# Patient Record
Sex: Male | Born: 1995 | Race: White | Hispanic: No | Marital: Single | Smoking: Never smoker
Health system: Southern US, Community
[De-identification: ages and names within clinical notes are randomized; demographics above are authoritative.]

## PROBLEM LIST (undated history)

## (undated) DIAGNOSIS — K8020 Calculus of gallbladder without cholecystitis without obstruction: Secondary | ICD-10-CM

---

## 2016-08-28 ENCOUNTER — Encounter (HOSPITAL_COMMUNITY): Payer: Self-pay | Admitting: *Deleted

## 2016-08-28 ENCOUNTER — Emergency Department (HOSPITAL_COMMUNITY): Payer: BLUE CROSS/BLUE SHIELD

## 2016-08-28 ENCOUNTER — Observation Stay (HOSPITAL_COMMUNITY)
Admission: EM | Admit: 2016-08-28 | Discharge: 2016-08-30 | Disposition: A | Discharge status: Home / Self Care | Payer: BLUE CROSS/BLUE SHIELD | Attending: General Surgery | Admitting: General Surgery

## 2016-08-28 DIAGNOSIS — K8065 Calculus of gallbladder and bile duct with chronic cholecystitis with obstruction: Secondary | ICD-10-CM | POA: Diagnosis not present

## 2016-08-28 DIAGNOSIS — K8021 Calculus of gallbladder without cholecystitis with obstruction: Secondary | ICD-10-CM | POA: Diagnosis present

## 2016-08-28 DIAGNOSIS — E876 Hypokalemia: Secondary | ICD-10-CM | POA: Diagnosis present

## 2016-08-28 DIAGNOSIS — K8020 Calculus of gallbladder without cholecystitis without obstruction: Secondary | ICD-10-CM

## 2016-08-28 DIAGNOSIS — R1011 Right upper quadrant pain: Secondary | ICD-10-CM

## 2016-08-28 DIAGNOSIS — Z419 Encounter for procedure for purposes other than remedying health state, unspecified: Secondary | ICD-10-CM

## 2016-08-28 HISTORY — DX: Calculus of gallbladder without cholecystitis without obstruction: K80.20

## 2016-08-28 LAB — COMPREHENSIVE METABOLIC PANEL
ALBUMIN: 4.6 g/dL (ref 3.5–5.0)
ALT: 13 U/L — AB (ref 17–63)
AST: 19 U/L (ref 15–41)
Alkaline Phosphatase: 36 U/L — ABNORMAL LOW (ref 38–126)
Anion gap: 10 (ref 5–15)
BUN: 11 mg/dL (ref 6–20)
CHLORIDE: 103 mmol/L (ref 101–111)
CO2: 25 mmol/L (ref 22–32)
Calcium: 9.2 mg/dL (ref 8.9–10.3)
Creatinine, Ser: 0.85 mg/dL (ref 0.61–1.24)
GFR calc Af Amer: >60 mL/min (ref >60)
Glucose, Bld: 90 mg/dL (ref 65–99)
Potassium: 3.1 mmol/L — ABNORMAL LOW (ref 3.5–5.1)
SODIUM: 138 mmol/L (ref 135–145)
Total Bilirubin: 2.6 mg/dL — ABNORMAL HIGH (ref 0.3–1.2)
Total Protein: 6.8 g/dL (ref 6.5–8.1)

## 2016-08-28 LAB — CBC WITH DIFFERENTIAL/PLATELET
BASOS ABS: 0 10*3/uL (ref 0.0–0.1)
BASOS PCT: 0 %
EOS ABS: 0.3 10*3/uL (ref 0.0–0.7)
EOS PCT: 5 %
HCT: 44.2 % (ref 39.0–52.0)
Hemoglobin: 15.1 g/dL (ref 13.0–17.0)
Lymphocytes Relative: 28 %
Lymphs Abs: 2 10*3/uL (ref 0.7–4.0)
MCH: 28.8 pg (ref 26.0–34.0)
MCHC: 34.2 g/dL (ref 30.0–36.0)
MCV: 84.2 fL (ref 78.0–100.0)
MONO ABS: 0.6 10*3/uL (ref 0.1–1.0)
Monocytes Relative: 9 %
Neutro Abs: 4.3 10*3/uL (ref 1.7–7.7)
Neutrophils Relative %: 58 %
PLATELETS: 210 10*3/uL (ref 150–400)
RBC: 5.25 MIL/uL (ref 4.22–5.81)
RDW: 13.7 % (ref 11.5–15.5)
WBC: 7.3 10*3/uL (ref 4.0–10.5)

## 2016-08-28 LAB — SURGICAL PCR SCREEN
MRSA, PCR: NEGATIVE
STAPHYLOCOCCUS AUREUS: POSITIVE — AB

## 2016-08-28 LAB — LIPASE, BLOOD: LIPASE: 17 U/L (ref 11–51)

## 2016-08-28 MED ORDER — ONDANSETRON 4 MG PO TBDP: 4.0000 mg | ORAL_TABLET | Freq: Four times a day (QID) | ORAL | Status: DC | PRN

## 2016-08-28 MED ORDER — MORPHINE SULFATE (PF) 4 MG/ML IV SOLN
4.0000 mg | Freq: Once | INTRAVENOUS | Status: AC
  Administered 2016-08-28: 4 mg via INTRAVENOUS
  Filled 2016-08-28: qty 1

## 2016-08-28 MED ORDER — MORPHINE SULFATE (PF) 4 MG/ML IV SOLN
1.0000 mg | INTRAVENOUS | Status: DC | PRN
  Administered 2016-08-29 – 2016-08-30 (×2): 1 mg via INTRAVENOUS
  Filled 2016-08-28 (×2): qty 1

## 2016-08-28 MED ORDER — DEXTROSE 5 % IV SOLN
2.0000 g | Freq: Every day | INTRAVENOUS | Status: DC
  Administered 2016-08-28 – 2016-08-30 (×3): 2 g via INTRAVENOUS
  Filled 2016-08-28 (×3): qty 2

## 2016-08-28 MED ORDER — ONDANSETRON HCL 4 MG/2ML IJ SOLN
4.0000 mg | Freq: Four times a day (QID) | INTRAMUSCULAR | Status: DC | PRN
Start: 2016-08-28 — End: 2016-08-30
  Administered 2016-08-29: 4 mg via INTRAVENOUS

## 2016-08-28 MED ORDER — DIPHENHYDRAMINE HCL 25 MG PO CAPS: 25.0000 mg | ORAL_CAPSULE | Freq: Four times a day (QID) | ORAL | Status: DC | PRN

## 2016-08-28 MED ORDER — DIPHENHYDRAMINE HCL 50 MG/ML IJ SOLN
25.0000 mg | Freq: Four times a day (QID) | INTRAMUSCULAR | Status: DC | PRN
  Administered 2016-08-29: 25 mg via INTRAVENOUS
  Filled 2016-08-28: qty 1

## 2016-08-28 MED ORDER — POTASSIUM CHLORIDE IN NACL 40-0.9 MEQ/L-% IV SOLN
INTRAVENOUS | Status: DC
  Administered 2016-08-28 – 2016-08-30 (×5): 100 mL/h via INTRAVENOUS
  Filled 2016-08-28 (×6): qty 1000

## 2016-08-28 NOTE — Progress Notes (Signed)
Called by surgery to see this patient in consult.  Patient with RUQ abdominal pain.  Total bili slightly elevated and gallstones noted on ultrasound.  CBD mildly dilated.  Dr. Marina GoodellPerry recommending cholecystectomy with IOC and if positive then to call us back and we will do a formal consult for ERCP at that point.  GI ATTENDING  As above. Labs and x-rays reviewed. Could have CBC stone but none by US and LFT's normal except mild elevation of bilirubin. No cholangitis. Would proceed to lap chole with IOC. If negative, able to avoid ERCP. If +, we will proceed with ERCP. Thanks  Wilhemina BonitoJohn N. Eda KeysPerry, Jr., M.D. [facility] Division of Gastroenterology

## 2016-08-28 NOTE — ED Provider Notes (Signed)
[facility]-EMERGENCY DEPT Provider Note   CSN: [id number] Arrival date & time: 08/28/16  0454     History   Chief Complaint Chief Complaint  Patient presents with  . Abdominal Pain    HPI Jerry Hamilton is a 21 y.o. male.  The history is provided by the patient and medical records.    21 y.o. M with hx of gallstones here with right upper abdominal pain which started about 3 hours ago after eating a piece of pizza.  Localized to RUQ.  No N/V/D.  No fever, chills, sweats.  Does report hx of gallstones in the past with similar symptoms.  No prior abdominal surgeries.  No medications taken PTA.  Reports hx of gallstones, however has never seen surgery for this.States he saw GI briefly, had MRCP.  Past Medical History:  Diagnosis Date  . Gallstones     There are no active problems to display for this patient.   History reviewed. No pertinent surgical history.     Home Medications    Prior to Admission medications   Not on File    Family History History reviewed. No pertinent family history.  Social History Social History  Substance Use Topics  . Smoking status: Never Smoker  . Smokeless tobacco: Never Used  . Alcohol use Yes     Allergies   Patient has no known allergies.   Review of Systems Review of Systems  Gastrointestinal: Positive for abdominal pain.  All other systems reviewed and are negative.    Physical Exam Updated Vital Signs BP (!) 147/91 (BP Location: Right Arm)   Pulse 67   Temp 97.5 F (36.4 C) (Oral)   Resp 18   Ht 5\' 11"  (1.803 m)   Wt 77.1 kg   BMI 23.71 kg/m   Physical Exam  Constitutional: He is oriented to person, place, and time. He appears well-developed and well-nourished.  HENT:  Head: Normocephalic and atraumatic.  Mouth/Throat: Oropharynx is clear and moist.  Eyes: Conjunctivae and EOM are normal. Pupils are equal, round, and reactive to light.  Neck: Normal range of motion.  Cardiovascular: Normal rate, regular  rhythm and normal heart sounds.   Pulmonary/Chest: Effort normal and breath sounds normal.  Abdominal: Soft. Bowel sounds are normal. There is tenderness in the right upper quadrant. There is no tenderness at McBurney's point.  Musculoskeletal: Normal range of motion.  Neurological: He is alert and oriented to person, place, and time.  Skin: Skin is warm and dry.  Psychiatric: He has a normal mood and affect.  Nursing note and vitals reviewed.    ED Treatments / Results  Labs (all labs ordered are listed, but only abnormal results are displayed) Labs Reviewed  COMPREHENSIVE METABOLIC PANEL - Abnormal; Notable for the following:       Result Value   Potassium 3.1 (*)    ALT 13 (*)    Alkaline Phosphatase 36 (*)    Total Bilirubin 2.6 (*)    All other components within normal limits  CBC WITH DIFFERENTIAL/PLATELET  LIPASE, BLOOD  HIV ANTIBODY (ROUTINE TESTING)    EKG  EKG Interpretation None       Radiology [place] Abdomen Limited Ruq  Result Date: 08/28/2016 CLINICAL DATA:  Acute right upper quadrant pain.  Chololithiasis. EXAM: US ABDOMEN LIMITED - RIGHT UPPER QUADRANT COMPARISON:  None. FINDINGS: Gallbladder: Multiple gallstones freely mobile within the gallbladder, largest stone 2.2 cm in diameter. Gallbladder wall thickness is normal. No Murphy sign. Common bile duct: Diameter: Dilatation of  the extrahepatic biliary tree with common duct measured up to 9.4 mm. No visible stone, but the presumption is that there is a stone at the distal duct/ ampulla. Liver: No parenchymal abnormality.  Echogenicity normal. IMPRESSION: Chololithiasis with multiple freely mobile stones within the gallbladder. The largest stone measures 2.2 cm in diameter. There are several smaller stones of a size that could enter the ductal system. In fact, the common bile duct is dilated up to 9.4 mm. A distal stone is not identified, but this study suggests the presence of a distal common bile duct or ampullary  stone. Electronically Signed   By: Paulina Fusi M.D.   On: 08/28/2016 07:03    Procedures Procedures (including critical care time)  Medications Ordered in ED Medications - No data to display   Initial Impression / Assessment and Plan / ED Course  I have reviewed the triage vital signs and the nursing notes.  Pertinent labs & imaging results that were available during my care of the patient were reviewed by me and considered in my medical decision making (see chart for details).  21 year old male here with upper abdominal pain. Reports history of gallstones with similar symptoms. He is afebrile and nontoxic. He does have tenderness in the right upper quadrant and epigastric region on exam without definitive Murphy sign. Lab work with mildly elevated bili at 2.6. He does not appear jaundiced.  Ultrasound was obtained revealing gallstones with noted is dilatation of the common bile duct is 9.4 mm. No definitive stone identified, however findings are concerning for choledocholithiasis versus ampullary stone.  On chart review, patient had similar ultrasound in October 2017, however CBD dilatation has increased.  Given his repeat evaluation, will speak with general surgery for recommendations.  Discussed with general surgery-- will admit for ongoing care. Plan for cholecystectomy during admission, GI consulted as well for possible ERCP.  Final Clinical Impressions(s) / ED Diagnoses   Final diagnoses:  RUQ pain  Calculus of gallbladder with biliary obstruction but without cholecystitis  Hypokalemia    New Prescriptions New Prescriptions   No medications on file     Garlon Hatchet, PA-C 08/28/16 1610    Pricilla Loveless, MD 09/06/16 1538

## 2016-08-28 NOTE — ED Triage Notes (Signed)
Patient is alert and oriented x4.  He is complaining of upper abdominal pain that woke him from sleep.  Patient states that he has a history of gallstones and states that it feels similar to the past gallstone attacks.  Currently he rates his pain 6 of 10 without nausea or vomiting.

## 2016-08-28 NOTE — H&P (Signed)
[facility] Admission Note  Jerry Hamilton 25-Nov-1995  [id number].    Requesting MD: Quincy Carnes PA Chief Complaint/Reason for Consult: Gallstones  HPI:  Jerry Hamilton is a 21yo male who presented to [facility] earlier this morning with acute onset RUQ pain after eating pizza last night. The pain is constant and does not radiate. Denies nausea, vomiting, fever, chills. He does have a history of known gallstones. States that he has had similar episodes in the past that resolved without intervention. States that he saw GI in [place] a few months ago who advised that at some point he would need a cholecystectomy. He had an MRCP at the time, no ERCP. Last meal last night  Hospital workup: - u/s shows chololithiasis with multiple freely mobile stones within the gallbladder, largest stone measures 2.2 cm in diameter; several smaller stones of a size that could enter the ductal system; common bile duct is dilated up to 9.4 mm; a distal stone is not identified, but this study suggests the presence of a distal common bile duct or ampullary stone - AST, ALT, alk phos, lipase, and WBL WNL - bilirubin 2.6  No significant PMH Abdominal surgical history: none Anticoagulants: none Nonsmoker Employment: college student  ROS: Review of Systems  Constitutional: Negative.   HENT: Negative.   Eyes: Negative.   Respiratory: Negative.   Cardiovascular: Negative.   Gastrointestinal: Positive for abdominal pain. Negative for blood in stool, constipation, diarrhea, heartburn, melena, nausea and vomiting.  Genitourinary: Negative.   Musculoskeletal: Negative.   Skin: Negative.   Neurological: Negative.   All systems reviewed and otherwise negative except for as above  History reviewed. No pertinent family history.  Past Medical History:  Diagnosis Date  . Gallstones     History reviewed. No pertinent surgical history.  Social History:  reports that he has never smoked. He has never  used smokeless tobacco. He reports that he drinks alcohol. His drug history is not on file.  Allergies: No Known Allergies   (Not in a hospital admission)  Prior to Admission medications   Not on File    Blood pressure 124/77, pulse 69, temperature 97.9 F (36.6 C), temperature source Oral, resp. rate 14, height _0  (1.803 m), weight 170 lb (77.1 kg), SpO2 99 %. Physical Exam: General: pleasant, WD/WN white male who is laying in bed in NAD HEENT: head is normocephalic, atraumatic.  Sclera are noninjected.  Mouth is pink and moist Heart: regular, rate, and rhythm.  No obvious murmurs, gallops, or rubs noted.  Palpable pedal pulses bilaterally Lungs: CTAB, no wheezes, rhonchi, or rales noted.  Respiratory effort nonlabored Abd: soft, ND, +BS, no masses, hernias, or organomegaly. +TTP RUQ MS: all 4 extremities are symmetrical with no cyanosis, clubbing, or edema. Skin: warm and dry with no masses, lesions, or rashes Psych: A&Ox3 with an appropriate affect. Neuro: CM 2-12 intact, extremity CSM intact bilaterally, normal speech  Results for orders placed or performed during the hospital encounter of 08/28/16 (from the past 48 hour(s))  CBC with Differential     Status: None   Collection Time: 08/28/16  5:42 AM  Result Value Ref Range   WBC 7.3 4.0 - 10.5 K/uL   RBC 5.25 4.22 - 5.81 MIL/uL   Hemoglobin 15.1 13.0 - 17.0 g/dL   HCT 44.2 39.0 - 52.0 %   MCV 84.2 78.0 - 100.0 fL   MCH 28.8 26.0 - 34.0 pg   MCHC 34.2 30.0 - 36.0 g/dL   RDW 13.7  11.5 - 15.5 %   Platelets 210 150 - 400 K/uL   Neutrophils Relative % 58 %   Neutro Abs 4.3 1.7 - 7.7 K/uL   Lymphocytes Relative 28 %   Lymphs Abs 2.0 0.7 - 4.0 K/uL   Monocytes Relative 9 %   Monocytes Absolute 0.6 0.1 - 1.0 K/uL   Eosinophils Relative 5 %   Eosinophils Absolute 0.3 0.0 - 0.7 K/uL   Basophils Relative 0 %   Basophils Absolute 0.0 0.0 - 0.1 K/uL  Comprehensive metabolic panel     Status: Abnormal   Collection Time:  08/28/16  5:42 AM  Result Value Ref Range   Sodium 138 135 - 145 mmol/L   Potassium 3.1 (L) 3.5 - 5.1 mmol/L   Chloride 103 101 - 111 mmol/L   CO2 25 22 - 32 mmol/L   Glucose, Bld 90 65 - 99 mg/dL   BUN 11 6 - 20 mg/dL   Creatinine, Ser 0.85 0.61 - 1.24 mg/dL   Calcium 9.2 8.9 - 10.3 mg/dL   Total Protein 6.8 6.5 - 8.1 g/dL   Albumin 4.6 3.5 - 5.0 g/dL   AST 19 15 - 41 U/L   ALT 13 (L) 17 - 63 U/L   Alkaline Phosphatase 36 (L) 38 - 126 U/L   Total Bilirubin 2.6 (H) 0.3 - 1.2 mg/dL   GFR calc non Af Amer >60 >60 mL/min   GFR calc Af Amer >60 >60 mL/min    Comment: (NOTE) The eGFR has been calculated using the CKD EPI equation. This calculation has not been validated in all clinical situations. eGFR's persistently <60 mL/min signify possible Chronic Kidney Disease.    Anion gap 10 5 - 15  Lipase, blood     Status: None   Collection Time: 08/28/16  5:42 AM  Result Value Ref Range   Lipase 17 11 - 51 U/L   US Abdomen Limited Ruq  Result Date: 08/28/2016 CLINICAL DATA:  Acute right upper quadrant pain.  Chololithiasis. EXAM: US ABDOMEN LIMITED - RIGHT UPPER QUADRANT COMPARISON:  None. FINDINGS: Gallbladder: Multiple gallstones freely mobile within the gallbladder, largest stone 2.2 cm in diameter. Gallbladder wall thickness is normal. No Murphy sign. Common bile duct: Diameter: Dilatation of the extrahepatic biliary tree with common duct measured up to 9.4 mm. No visible stone, but the presumption is that there is a stone at the distal duct/ ampulla. Liver: No parenchymal abnormality.  Echogenicity normal. IMPRESSION: Chololithiasis with multiple freely mobile stones within the gallbladder. The largest stone measures 2.2 cm in diameter. There are several smaller stones of a size that could enter the ductal system. In fact, the common bile duct is dilated up to 9.4 mm. A distal stone is not identified, but this study suggests the presence of a distal common bile duct or ampullary stone.  Electronically Signed   By: Nelson Chimes M.D.   On: 08/28/2016 07:03      Assessment/Plan Cholelithiasis, hyperbilirubinemia - no prior h/o abdominal surgery - known h/o gallstones with history of several attacks in the past - u/s shows chololithiasis with multiple freely mobile stones within the gallbladder, largest stone measures 2.2 cm in diameter; several smaller stones of a size that could enter the ductal system; common bile duct is dilated up to 9.4 mm; a distal stone is not identified, but this study suggests the presence of a distal common bile duct or ampullary stone - AST, ALT, alk phos, lipase, and WBL WNL - bilirubin 2.6  Plan - Admit to med-surg. Will consult GI to consider MRCP vs ERCP. NPO, IVF, pain control. Plan for cholecystectomy this admission.  Jerrye Beavers, [facility] Surgery 08/28/2016, 7:57 AM Pager: [phone] Consults: [phone] Mon-Fri 7:00 am-4:30 pm Sat-Sun 7:00 am-11:30 am

## 2016-08-29 ENCOUNTER — Inpatient Hospital Stay: Admit: 2016-08-29 | Payer: BLUE CROSS/BLUE SHIELD | Admitting: General Surgery

## 2016-08-29 ENCOUNTER — Observation Stay (HOSPITAL_COMMUNITY): Payer: BLUE CROSS/BLUE SHIELD

## 2016-08-29 ENCOUNTER — Encounter (HOSPITAL_COMMUNITY): Payer: Self-pay

## 2016-08-29 ENCOUNTER — Encounter (HOSPITAL_COMMUNITY)
Admission: EM | Disposition: A | Discharge status: Home / Self Care | Payer: Self-pay | Source: Home / Self Care | Attending: Emergency Medicine

## 2016-08-29 ENCOUNTER — Observation Stay (HOSPITAL_COMMUNITY): Payer: BLUE CROSS/BLUE SHIELD | Admitting: Anesthesiology

## 2016-08-29 HISTORY — PX: CHOLECYSTECTOMY: SHX55

## 2016-08-29 LAB — CBC
HCT: 42.7 % (ref 39.0–52.0)
Hemoglobin: 14.6 g/dL (ref 13.0–17.0)
MCH: 29 pg (ref 26.0–34.0)
MCHC: 34.2 g/dL (ref 30.0–36.0)
MCV: 84.7 fL (ref 78.0–100.0)
PLATELETS: 208 10*3/uL (ref 150–400)
RBC: 5.04 MIL/uL (ref 4.22–5.81)
RDW: 13.8 % (ref 11.5–15.5)
WBC: 6.2 10*3/uL (ref 4.0–10.5)

## 2016-08-29 LAB — COMPREHENSIVE METABOLIC PANEL
ALT: 14 U/L — AB (ref 17–63)
AST: 18 U/L (ref 15–41)
Albumin: 4.4 g/dL (ref 3.5–5.0)
Alkaline Phosphatase: 37 U/L — ABNORMAL LOW (ref 38–126)
Anion gap: 8 (ref 5–15)
BUN: 8 mg/dL (ref 6–20)
CALCIUM: 9.3 mg/dL (ref 8.9–10.3)
CHLORIDE: 108 mmol/L (ref 101–111)
CO2: 26 mmol/L (ref 22–32)
CREATININE: 0.83 mg/dL (ref 0.61–1.24)
GFR calc Af Amer: >60 mL/min (ref >60)
Glucose, Bld: 90 mg/dL (ref 65–99)
POTASSIUM: 4 mmol/L (ref 3.5–5.1)
Sodium: 142 mmol/L (ref 135–145)
Total Bilirubin: 3.1 mg/dL — ABNORMAL HIGH (ref 0.3–1.2)
Total Protein: 6.5 g/dL (ref 6.5–8.1)

## 2016-08-29 LAB — HIV ANTIBODY (ROUTINE TESTING W REFLEX): HIV SCREEN 4TH GENERATION: NONREACTIVE

## 2016-08-29 SURGERY — LAPAROSCOPIC CHOLECYSTECTOMY WITH INTRAOPERATIVE CHOLANGIOGRAM
Anesthesia: General | Site: Abdomen

## 2016-08-29 MED ORDER — HYDROMORPHONE HCL 1 MG/ML IJ SOLN
INTRAMUSCULAR | Status: AC
  Filled 2016-08-29: qty 0.5

## 2016-08-29 MED ORDER — BUPIVACAINE-EPINEPHRINE 0.25% -1:200000 IJ SOLN
INTRAMUSCULAR | Status: DC | PRN
  Administered 2016-08-29: 30 mL

## 2016-08-29 MED ORDER — LACTATED RINGERS IR SOLN
Status: DC | PRN
  Administered 2016-08-29: 1000 mL

## 2016-08-29 MED ORDER — DEXAMETHASONE SODIUM PHOSPHATE 10 MG/ML IJ SOLN
INTRAMUSCULAR | Status: DC | PRN
  Administered 2016-08-29: 10 mg via INTRAVENOUS

## 2016-08-29 MED ORDER — KETOROLAC TROMETHAMINE 30 MG/ML IJ SOLN
INTRAMUSCULAR | Status: AC
  Filled 2016-08-29: qty 1

## 2016-08-29 MED ORDER — SCOPOLAMINE 1 MG/3DAYS TD PT72
1.0000 | MEDICATED_PATCH | TRANSDERMAL | Status: DC
  Administered 2016-08-29: 1.5 mg via TRANSDERMAL

## 2016-08-29 MED ORDER — ONDANSETRON HCL 4 MG/2ML IJ SOLN
INTRAMUSCULAR | Status: AC
  Filled 2016-08-29: qty 2

## 2016-08-29 MED ORDER — GLUCAGON HCL RDNA (DIAGNOSTIC) 1 MG IJ SOLR
1.0000 mg | Freq: Once | INTRAMUSCULAR | Status: DC | PRN
  Filled 2016-08-29: qty 1

## 2016-08-29 MED ORDER — LIDOCAINE HCL (CARDIAC) 20 MG/ML IV SOLN
INTRAVENOUS | Status: DC | PRN
  Administered 2016-08-29: 100 mg via INTRAVENOUS

## 2016-08-29 MED ORDER — GADOBENATE DIMEGLUMINE 529 MG/ML IV SOLN
15.0000 mL | Freq: Once | INTRAVENOUS | Status: AC | PRN
  Administered 2016-08-29: 15 mL via INTRAVENOUS

## 2016-08-29 MED ORDER — LACTATED RINGERS IV SOLN
INTRAVENOUS | Status: DC
  Administered 2016-08-29: 1000 mL via INTRAVENOUS
  Administered 2016-08-29: 15:00:00 via INTRAVENOUS

## 2016-08-29 MED ORDER — DEXAMETHASONE SODIUM PHOSPHATE 10 MG/ML IJ SOLN
INTRAMUSCULAR | Status: AC
  Filled 2016-08-29: qty 1

## 2016-08-29 MED ORDER — HYDROMORPHONE HCL 1 MG/ML IJ SOLN: 0.2500 mg | INTRAMUSCULAR | Status: DC | PRN

## 2016-08-29 MED ORDER — MIDAZOLAM HCL 2 MG/2ML IJ SOLN
INTRAMUSCULAR | Status: AC
  Filled 2016-08-29: qty 2

## 2016-08-29 MED ORDER — SUGAMMADEX SODIUM 500 MG/5ML IV SOLN
INTRAVENOUS | Status: AC
  Filled 2016-08-29: qty 5

## 2016-08-29 MED ORDER — KETOROLAC TROMETHAMINE 30 MG/ML IJ SOLN
INTRAMUSCULAR | Status: DC | PRN
  Administered 2016-08-29: 30 mg via INTRAVENOUS

## 2016-08-29 MED ORDER — IOPAMIDOL (ISOVUE-300) INJECTION 61%
INTRAVENOUS | Status: AC
  Filled 2016-08-29: qty 50

## 2016-08-29 MED ORDER — ROCURONIUM BROMIDE 100 MG/10ML IV SOLN
INTRAVENOUS | Status: DC | PRN
  Administered 2016-08-29: 40 mg via INTRAVENOUS
  Administered 2016-08-29 (×2): 10 mg via INTRAVENOUS

## 2016-08-29 MED ORDER — EPHEDRINE 5 MG/ML INJ
INTRAVENOUS | Status: AC
  Filled 2016-08-29: qty 10

## 2016-08-29 MED ORDER — FENTANYL CITRATE (PF) 250 MCG/5ML IJ SOLN
INTRAMUSCULAR | Status: AC
  Filled 2016-08-29: qty 5

## 2016-08-29 MED ORDER — FENTANYL CITRATE (PF) 100 MCG/2ML IJ SOLN
INTRAMUSCULAR | Status: AC
  Filled 2016-08-29: qty 2

## 2016-08-29 MED ORDER — LIDOCAINE 2% (20 MG/ML) 5 ML SYRINGE
INTRAMUSCULAR | Status: AC
  Filled 2016-08-29: qty 5

## 2016-08-29 MED ORDER — KETOROLAC TROMETHAMINE 15 MG/ML IJ SOLN
15.0000 mg | Freq: Four times a day (QID) | INTRAMUSCULAR | Status: DC | PRN
  Administered 2016-08-29 – 2016-08-30 (×2): 15 mg via INTRAVENOUS
  Filled 2016-08-29 (×2): qty 1

## 2016-08-29 MED ORDER — 0.9 % SODIUM CHLORIDE (POUR BTL) OPTIME
TOPICAL | Status: DC | PRN
  Administered 2016-08-29: 1000 mL

## 2016-08-29 MED ORDER — PROPOFOL 10 MG/ML IV BOLUS
INTRAVENOUS | Status: DC | PRN
  Administered 2016-08-29: 200 mg via INTRAVENOUS

## 2016-08-29 MED ORDER — ROCURONIUM BROMIDE 50 MG/5ML IV SOSY
PREFILLED_SYRINGE | INTRAVENOUS | Status: AC
  Filled 2016-08-29: qty 5

## 2016-08-29 MED ORDER — SUGAMMADEX SODIUM 500 MG/5ML IV SOLN
INTRAVENOUS | Status: DC | PRN
  Administered 2016-08-29: 300 mg via INTRAVENOUS

## 2016-08-29 MED ORDER — PROMETHAZINE HCL 25 MG/ML IJ SOLN: 6.2500 mg | INTRAMUSCULAR | Status: DC | PRN

## 2016-08-29 MED ORDER — SCOPOLAMINE 1 MG/3DAYS TD PT72
MEDICATED_PATCH | TRANSDERMAL | Status: AC
  Administered 2016-08-29: 1.5 mg via TRANSDERMAL
  Filled 2016-08-29: qty 1

## 2016-08-29 MED ORDER — PROPOFOL 10 MG/ML IV BOLUS
INTRAVENOUS | Status: AC
  Filled 2016-08-29: qty 20

## 2016-08-29 MED ORDER — BUPIVACAINE-EPINEPHRINE (PF) 0.5% -1:200000 IJ SOLN
INTRAMUSCULAR | Status: AC
  Filled 2016-08-29: qty 30

## 2016-08-29 MED ORDER — EPHEDRINE SULFATE 50 MG/ML IJ SOLN
INTRAMUSCULAR | Status: DC | PRN
  Administered 2016-08-29 (×2): 5 mg via INTRAVENOUS

## 2016-08-29 MED ORDER — FENTANYL CITRATE (PF) 100 MCG/2ML IJ SOLN
INTRAMUSCULAR | Status: DC | PRN
  Administered 2016-08-29 (×3): 25 ug via INTRAVENOUS
  Administered 2016-08-29: 50 ug via INTRAVENOUS
  Administered 2016-08-29: 100 ug via INTRAVENOUS
  Administered 2016-08-29: 25 ug via INTRAVENOUS
  Administered 2016-08-29: 50 ug via INTRAVENOUS
  Administered 2016-08-29: 25 ug via INTRAVENOUS

## 2016-08-29 MED ORDER — MIDAZOLAM HCL 5 MG/5ML IJ SOLN
INTRAMUSCULAR | Status: DC | PRN
  Administered 2016-08-29: 2 mg via INTRAVENOUS

## 2016-08-29 SURGICAL SUPPLY — 51 items
APPLICATOR ARISTA FLEXITIP XL (MISCELLANEOUS) IMPLANT
APPLIER CLIP 5 13 M/L LIGAMAX5 (MISCELLANEOUS) ×3
APPLIER CLIP ROT 10 11.4 M/L (STAPLE)
BANDAGE ADH SHEER 1  50/CT (GAUZE/BANDAGES/DRESSINGS) IMPLANT
BENZOIN TINCTURE PRP APPL 2/3 (GAUZE/BANDAGES/DRESSINGS) ×3 IMPLANT
CABLE HIGH FREQUENCY MONO STRZ (ELECTRODE) ×3 IMPLANT
CHLORAPREP W/TINT 26ML (MISCELLANEOUS) ×3 IMPLANT
CLIP APPLIE 5 13 M/L LIGAMAX5 (MISCELLANEOUS) ×1 IMPLANT
CLIP APPLIE ROT 10 11.4 M/L (STAPLE) IMPLANT
CLIP LIGATING HEMO O LOK GREEN (MISCELLANEOUS) IMPLANT
CLOSURE WOUND 1/2 X4 (GAUZE/BANDAGES/DRESSINGS) ×1
COVER MAYO STAND STRL (DRAPES) IMPLANT
COVER SURGICAL LIGHT HANDLE (MISCELLANEOUS) ×3 IMPLANT
DECANTER SPIKE VIAL GLASS SM (MISCELLANEOUS) IMPLANT
DERMABOND ADVANCED (GAUZE/BANDAGES/DRESSINGS)
DERMABOND ADVANCED .7 DNX12 (GAUZE/BANDAGES/DRESSINGS) IMPLANT
DEVICE PMI PUNCTURE CLOSURE (MISCELLANEOUS) IMPLANT
DRAPE C-ARM 42X120 X-RAY (DRAPES) IMPLANT
DRSG TEGADERM 2-3/8X2-3/4 SM (GAUZE/BANDAGES/DRESSINGS) ×3 IMPLANT
ELECT PENCIL ROCKER SW 15FT (MISCELLANEOUS) IMPLANT
ELECT REM PT RETURN 15FT ADLT (MISCELLANEOUS) ×3 IMPLANT
ENDOLOOP SUT PDS II  0 18 (SUTURE) ×2
ENDOLOOP SUT PDS II 0 18 (SUTURE) ×1 IMPLANT
GAUZE SPONGE 2X2 8PLY STRL LF (GAUZE/BANDAGES/DRESSINGS) IMPLANT
GLOVE BIO SURGEON STRL SZ7.5 (GLOVE) ×3 IMPLANT
GLOVE INDICATOR 8.0 STRL GRN (GLOVE) ×3 IMPLANT
GOWN STRL REUS W/TWL XL LVL3 (GOWN DISPOSABLE) ×9 IMPLANT
HEMOSTAT ARISTA ABSORB 3G PWDR (MISCELLANEOUS) IMPLANT
HEMOSTAT SNOW SURGICEL 2X4 (HEMOSTASIS) IMPLANT
IRRIG SUCT STRYKERFLOW 2 WTIP (MISCELLANEOUS) ×3
IRRIGATION SUCT STRKRFLW 2 WTP (MISCELLANEOUS) ×1 IMPLANT
KIT BASIN OR (CUSTOM PROCEDURE TRAY) ×3 IMPLANT
L-HOOK LAP DISP 36CM (ELECTROSURGICAL)
LHOOK LAP DISP 36CM (ELECTROSURGICAL) IMPLANT
POUCH RETRIEVAL ECOSAC 10 (ENDOMECHANICALS) ×1 IMPLANT
POUCH RETRIEVAL ECOSAC 10MM (ENDOMECHANICALS) ×2
SCISSORS LAP 5X35 DISP (ENDOMECHANICALS) ×3 IMPLANT
SET CHOLANGIOGRAPH MIX (MISCELLANEOUS) IMPLANT
SLEEVE XCEL OPT CAN 5 100 (ENDOMECHANICALS) ×6 IMPLANT
SPONGE GAUZE 2X2 STER 10/PKG (GAUZE/BANDAGES/DRESSINGS)
STRIP CLOSURE SKIN 1/2X4 (GAUZE/BANDAGES/DRESSINGS) ×2 IMPLANT
SUT MNCRL AB 4-0 PS2 18 (SUTURE) ×6 IMPLANT
SUT VICRYL 0 TIES 12 18 (SUTURE) IMPLANT
SUT VICRYL 0 UR6 27IN ABS (SUTURE) ×3 IMPLANT
TOWEL OR 17X26 10 PK STRL BLUE (TOWEL DISPOSABLE) ×3 IMPLANT
TOWEL OR NON WOVEN STRL DISP B (DISPOSABLE) ×3 IMPLANT
TRAY LAPAROSCOPIC (CUSTOM PROCEDURE TRAY) ×3 IMPLANT
TROCAR BLADELESS OPT 5 100 (ENDOMECHANICALS) ×3 IMPLANT
TROCAR XCEL BLUNT TIP 100MML (ENDOMECHANICALS) IMPLANT
TROCAR XCEL NON-BLD 11X100MML (ENDOMECHANICALS) IMPLANT
TUBING INSUF HEATED (TUBING) ×3 IMPLANT

## 2016-08-29 NOTE — Progress Notes (Signed)
Subjective: No c/o.   Objective: Vital signs in last 24 hours: Temp:  [97.1 F (36.2 C)-98.4 F (36.9 C)] 97.7 F (36.5 C) (03/23 0550) Pulse Rate:  [62-80] 62 (03/23 0550) Resp:  [16-18] 16 (03/23 0550) BP: (102-137)/(62-92) 102/64 (03/23 0550) SpO2:  [98 %-100 %] 98 % (03/23 0550) Last BM Date: 08/27/16  Intake/Output from previous day: 03/22 0701 - 03/23 0700 In: 2131.7 [P.O.:600; I.V.:1481.7; IV Piggyback:50] Out: 900 [Urine:900] Intake/Output this shift: No intake/output data recorded.  Alert, nad cta Soft, nt, nd No edema  Lab Results:   Recent Labs  08/28/16 0542 08/29/16 0415  WBC 7.3 6.2  HGB 15.1 14.6  HCT 44.2 42.7  PLT 210 208   BMET  Recent Labs  08/28/16 0542 08/29/16 0415  NA 138 142  K 3.1* 4.0  CL 103 108  CO2 25 26  GLUCOSE 90 90  BUN 11 8  CREATININE 0.85 0.83  CALCIUM 9.2 9.3   Hepatic Function Latest Ref Rng & Units 08/29/2016 08/28/2016  Total Protein 6.5 - 8.1 g/dL 6.5 6.8  Albumin 3.5 - 5.0 g/dL 4.4 4.6  AST 15 - 41 U/L 18 19  ALT 17 - 63 U/L 14(L) 13(L)  Alk Phosphatase 38 - 126 U/L 37(L) 36(L)  Total Bilirubin 0.3 - 1.2 mg/dL 3.1(H) 2.6(H)    PT/INR No results for input(s): LABPROT, INR in the last 72 hours. ABG No results for input(s): PHART, HCO3 in the last 72 hours.  Invalid input(s): PCO2, PO2  Studies/Results: US Abdomen Limited Ruq  Result Date: 08/28/2016 CLINICAL DATA:  Acute right upper quadrant pain.  Chololithiasis. EXAM: US ABDOMEN LIMITED - RIGHT UPPER QUADRANT COMPARISON:  None. FINDINGS: Gallbladder: Multiple gallstones freely mobile within the gallbladder, largest stone 2.2 cm in diameter. Gallbladder wall thickness is normal. No Murphy sign. Common bile duct: Diameter: Dilatation of the extrahepatic biliary tree with common duct measured up to 9.4 mm. No visible stone, but the presumption is that there is a stone at the distal duct/ ampulla. Liver: No parenchymal abnormality.  Echogenicity normal.  IMPRESSION: Chololithiasis with multiple freely mobile stones within the gallbladder. The largest stone measures 2.2 cm in diameter. There are several smaller stones of a size that could enter the ductal system. In fact, the common bile duct is dilated up to 9.4 mm. A distal stone is not identified, but this study suggests the presence of a distal common bile duct or ampullary stone. Electronically Signed   By: Nelson Chimes M.D.   On: 08/28/2016 07:03    Anti-infectives: Anti-infectives    Start     Dose/Rate Route Frequency Ordered Stop   08/28/16 1200  cefTRIAXone (ROCEPHIN) 2 g in dextrose 5 % 50 mL IVPB     2 g 100 mL/hr over 30 Minutes Intravenous Daily 08/28/16 0954        Assessment/Plan: Symptomatic cholelithiasis Dilated bile duct Elevated T bilirubin  His bilirubin is up some from yesterday. Concerning for CBD stone.  Re-discussed with GI. They don't have anesthesia coverage for ERCP today so they recommended proceeding with cholecystectomy. If IOC positive, they said they could do ERCP this weekend  So we will proceed with Lap chole with ioc  I believe the patient's symptoms are consistent with gallbladder disease.  We discussed gallbladder disease. I drew diagram. We discussed non-operative and operative management.   I discussed laparoscopic cholecystectomy with IOC in detail.  The patient was given educational material as well as diagrams detailing the procedure.  We discussed the risks and benefits of a laparoscopic cholecystectomy including, but not limited to bleeding, infection, injury to surrounding structures such as the intestine or liver, bile leak, retained gallstones, need to convert to an open procedure, prolonged diarrhea, blood clots such as  DVT, common bile duct injury, anesthesia risks, and possible need for additional procedures.  We discussed the typical post-operative recovery course. I explained that the likelihood of improvement of their symptoms is good.  I did explain that it was a decent chance his IOC would be positive.   He has agreed to proceed with surgery  Leighton Ruff. Redmond Pulling, MD, FACS General, Bariatric, & Minimally Invasive Surgery [facility], [place]    LOS: 0 days    Gayland Curry 08/29/2016

## 2016-08-29 NOTE — Progress Notes (Signed)
Called about him having bleeding from periumbilical wound.  On exam, dressing is saturated with blood.  Dressing removed. Small amount of bleeding noted from incision that stopped with direct pressure.  Pressure dressing applied.

## 2016-08-29 NOTE — Discharge Instructions (Signed)
°LAPAROSCOPIC SURGERY: POST OP INSTRUCTIONS  °1. DIET: Follow a light bland diet the first 24 hours after arrival home, such as soup, liquids, crackers, etc. Be sure to include lots of fluids daily. Avoid fast food or heavy meals as your are more likely to get nauseated. Eat a low fat the next few days after surgery.  °2. Take your usually prescribed home medications unless otherwise directed. °3. PAIN CONTROL:  °1. Pain is best controlled by a usual combination of three different methods TOGETHER:  °1. Ice/Heat °2. Over the counter pain medication °3. Prescription pain medication °2. Most patients will experience some swelling and bruising around the incisions. Ice packs or heating pads (30-60 minutes up to 6 times a day) will help. Use ice for the first few days to help decrease swelling and bruising, then switch to heat to help relax tight/sore spots and speed recovery. Some people prefer to use ice alone, heat alone, alternating between ice & heat. Experiment to what works for you. Swelling and bruising can take several weeks to resolve.  °3. It is helpful to take an over-the-counter pain medication regularly for the first few weeks. Choose one of the following that works best for you:  °1. Naproxen (Aleve, etc) Two 220mg tabs twice a day °2. Ibuprofen (Advil, etc) Three 200mg tabs four times a day (every meal & bedtime) °3. Acetaminophen (Tylenol, etc) 500-650mg four times a day (every meal & bedtime) °4. A prescription for pain medication (such as oxycodone, hydrocodone, etc) should be given to you upon discharge. Take your pain medication as prescribed.  °1. If you are having problems/concerns with the prescription medicine (does not control pain, nausea, vomiting, rash, itching, etc), please call us [phone] to see if we need to switch you to a different pain medicine that will work better for you and/or control your side effect better. °2. If you need a refill on your pain medication, please contact  your pharmacy. They will contact our office to request authorization. Prescriptions will not be filled after 5 pm or on week-ends. °4. Avoid getting constipated. Between the surgery and the pain medications, it is common to experience some constipation. Increasing fluid intake and taking a fiber supplement (such as Metamucil, Citrucel, FiberCon, MiraLax, etc) 1-2 times a day regularly will usually help prevent this problem from occurring. A mild laxative (prune juice, Milk of Magnesia, MiraLax, etc) should be taken according to package directions if there are no bowel movements after 48 hours.  °5. Watch out for diarrhea. If you have many loose bowel movements, simplify your diet to bland foods & liquids for a few days. Stop any stool softeners and decrease your fiber supplement. Switching to mild anti-diarrheal medications (Kayopectate, Pepto Bismol) can help. If this worsens or does not improve, please call us. °6. Wash / shower every day. You may shower over the dressings as they are waterproof. Continue to shower over incision(s) after the dressing is off. If there is glue over the incisions try not to pick it off, let it fall off naturally. °7. Remove your waterproof bandages 2 days after surgery. You may leave the incision open to air. You may replace a dressing/Band-Aid to cover the incision for comfort if you wish.  °8. ACTIVITIES as tolerated:  °1. You may resume regular (light) daily activities beginning the next day--such as daily self-care, walking, climbing stairs--gradually increasing activities as tolerated. If you can walk 30 minutes without difficulty, it is safe to try more intense activity   such as jogging, treadmill, bicycling, low-impact aerobics, swimming, etc. °2. Save the most intensive and strenuous activity for last such as sit-ups, heavy lifting, contact sports, etc Refrain from any heavy lifting or straining until you are off narcotics for pain control. For the first 2-3 weeks do not lift  over 10-15lb.  °3. DO NOT PUSH THROUGH PAIN. Let pain be your guide: If it hurts to do something, don't do it. Pain is your body warning you to avoid that activity for another week until the pain goes down. °4. You may drive when you are no longer taking prescription pain medication, you can comfortably wear a seatbelt, and you can safely maneuver your car and apply brakes. °5. You may have sexual intercourse when it is comfortable.  °9. FOLLOW UP in our office  °1. Please call [facility] at [phone] to set up an appointment to see your surgeon in the office for a follow-up appointment approximately 2-3 weeks after your surgery. °2. Make sure that you call for this appointment the day you arrive home to insure a convenient appointment time. °     10. IF YOU HAVE DISABILITY OR FAMILY LEAVE FORMS, BRING THEM TO THE               OFFICE FOR PROCESSING.  ° °WHEN TO CALL US [phone]:  °1. Poor pain control °2. Reactions / problems with new medications (rash/itching, nausea, etc)  °3. Fever over 101.5 F (38.5 C) °4. Inability to urinate °5. Nausea and/or vomiting °6. Worsening swelling or bruising °7. Continued bleeding from incision. °8. Increased pain, redness, or drainage from the incision ° °The clinic staff is available to answer your questions during regular business hours (8:30am-5pm). Please don’t hesitate to call and ask to speak to one of our nurses for clinical concerns.  °If you have a medical emergency, go to the nearest emergency room or call 911.  °A surgeon from [facility] is always on call at the hospitals  ° °[facility]  °[address], [address], [place], [place] [address] ?  °MAIN: [phone] ? TOLL FREE: [phone] ?  °FAX [phone]  °[email or web address] ° ° °

## 2016-08-29 NOTE — Progress Notes (Signed)
Rn in patient's room doing post op assessment and all of patient's lap sites were clean dry and intact with bandaids and steri strips, ice pack in place from OR. Patient stated he had pain 4 out of 10 and was given IV toradol for pain. As Rn was administering IV toradol, Rn noticed a small amount of blood oozing from steri strips on top midline incision. RN applied pressure and bleeding stopped. RN applied several 4x4 gauze over steri strips with tegaderm covering over top of gauze. RN came back into patient's room to assess him 10 minutes later and blood was completely saturating gauze and tegaderm and blood oozing out of tegaderm. RN paged Dr Abbey Chattersosenbower, on call, and he came up to floor. RN assisted Dr Abbey Chattersosenbower as he held pressure for several minutes and cleaned up wound, then applied several 4x4 gauze pads and then a tight tape dressing over wound. Dr Abbey Chattersosenbower verbally instructed patient to remain on bedrest for one hour and that MD would be up on the floor for a while, if needed. SCDs on, patient's urinal nearby, call bell within reach.

## 2016-08-29 NOTE — Anesthesia Preprocedure Evaluation (Addendum)
Anesthesia Evaluation  Patient identified by MRN, date of birth, ID band Patient awake    Reviewed: Allergy & Precautions, NPO status , Patient's Chart, lab work & pertinent test results  Airway Mallampati: II  TM Distance: >3 FB Neck ROM: Full    Dental no notable dental hx. (+) Dental Advisory Given   Pulmonary neg pulmonary ROS,    Pulmonary exam normal        Cardiovascular negative cardio ROS Normal cardiovascular exam     Neuro/Psych negative neurological ROS  negative psych ROS   GI/Hepatic Neg liver ROS,   Endo/Other  negative endocrine ROS  Renal/GU negative Renal ROS  negative genitourinary   Musculoskeletal negative musculoskeletal ROS (+)   Abdominal   Peds negative pediatric ROS (+)  Hematology negative hematology ROS (+)   Anesthesia Other Findings   Reproductive/Obstetrics negative OB ROS                             Anesthesia Physical Anesthesia Plan  ASA: I  Anesthesia Plan: General   Post-op Pain Management:    Induction: Intravenous  Airway Management Planned: Oral ETT  Additional Equipment:   Intra-op Plan:   Post-operative Plan: Extubation in OR  Informed Consent: I have reviewed the patients History and Physical, chart, labs and discussed the procedure including the risks, benefits and alternatives for the proposed anesthesia with the patient or authorized representative who has indicated his/her understanding and acceptance.   Dental advisory given  Plan Discussed with: CRNA and Anesthesiologist  Anesthesia Plan Comments:         Anesthesia Quick Evaluation

## 2016-08-29 NOTE — Op Note (Signed)
Jerry Hamilton [id number] 01/24/1996 08/29/2016  Laparoscopic Cholecystectomy with attempted IOC Procedure Note  Indications: This patient presents with symptomatic gallbladder disease and will undergo laparoscopic cholecystectomy. Please see chart for additional details. Patient has known gallstone disease. He had a recurrent episode of pain and was admitted after having evidence of a dilated bile duct up to 1 cm and a T bilirubin of 2.6. This morning's bilirubin was 3.1. We rediscussed with GI medicine. They recommended proceeding with cholecystectomy and if IOC was positive then they would proceed with a postoperative ERCP.  Pre-operative Diagnosis: Symptomatic cholelithiasis, dilated common bile duct  Post-operative Diagnosis: Same  Surgeon: Atilano InaWILSON,Celedonio Sortino M   Assistants: Evangeline GulaBrooke Miller, PA  Anesthesia: General endotracheal anesthesia  Procedure Details  The patient was seen again in the Holding Room. I discussed surgery with his mother along with risks and benefits. The risks, benefits, complications, treatment options, and expected outcomes were discussed with the patient. The possibilities of reaction to medication, pulmonary aspiration, perforation of viscus, bleeding, recurrent infection, finding a normal gallbladder, the need for additional procedures, failure to diagnose a condition, the possible need to convert to an open procedure, and creating a complication requiring transfusion or operation were discussed with the patient. The likelihood of improving the patient's symptoms with return to their baseline status is good.  The patient and/or family concurred with the proposed plan, giving informed consent. It was observed that the patient had developed a rash on his upper trunk and bilateral upper extremities. This occurred after his CH bath on the floor. He was administered Benadryl. There was no signs of short of breath or anaphylaxis.  The site of surgery properly noted. The patient was  taken to Operating Room, identified as Jerry Hamilton and the procedure verified as Laparoscopic Cholecystectomy with Intraoperative Cholangiogram. A Time Out was held and the above information confirmed. Antibiotic prophylaxis was administered.   Prior to the induction of general anesthesia, antibiotic prophylaxis was administered. General endotracheal anesthesia was then administered and tolerated well. After the induction, the abdomen was prepped with Chloraprep and draped in the sterile fashion. The patient was positioned in the supine position.  Local anesthetic agent was injected into the skin near the umbilicus and an incision made. We dissected down to the abdominal fascia with blunt dissection.  The fascia was incised vertically and we entered the peritoneal cavity bluntly.  A pursestring suture of 0-Vicryl was placed around the fascial opening.  The Hasson cannula was inserted and secured with the stay suture.  Pneumoperitoneum was then created with CO2 and tolerated well without any adverse changes in the patient's vital signs. An 5-mm port was placed in the subxiphoid position.  Two 5-mm ports were placed in the right upper quadrant. All skin incisions were infiltrated with a local anesthetic agent before making the incision and placing the trocars.   We positioned the patient in reverse Trendelenburg, tilted slightly to the patient's left.  The gallbladder was identified, the fundus grasped and retracted cephalad. Adhesions were lysed bluntly and with the electrocautery where indicated, taking care not to injure any adjacent organs or viscus. The infundibulum was grasped and retracted laterally, exposing the peritoneum overlying the triangle of Calot. This was then divided and exposed in a blunt fashion. A critical view of the cystic duct and cystic artery was obtained.  The cystic duct was clearly identified and bluntly dissected circumferentially. the cystic duct was dilated along with the  common bile duct.  The cystic duct was ligated with  a clip distally.   An incision was made in the cystic duct and the [facility] cholangiogram catheter introduced. The catheter was secured using a clip. I flushed the catheter however saline leaked out of the cystic duct around the catheter. I removed the clip and reposition the catheter and resecured it and tried to flush it again. Unfortunately continued to leak around the cholangiocatheter. I attempted a third time and had the same results. It was unclear if this was happening because of the dilation of the cystic duct or due to a downstream obstruction. Nonetheless after 3 attempts for cholangiogram I decided not to proceed. The clip and the cholangiocatheter and catheter removed.   The cystic duct was then ligated with clip and divided.  given Haldol dilated the cystic duct was the clip barely went all the way across it I decided to place a PDS Endoloop below the level of the clip on the cystic duct stump. The cystic artery which had been identified & dissected free was ligated with clips and divided as well.   The gallbladder was dissected from the liver bed in retrograde fashion with the electrocautery. The gallbladder was removed and placed in an Ecco sac.  The gallbladder and Ecco sac were then removed through the umbilical port site. The liver bed was irrigated and inspected. Hemostasis was achieved with the electrocautery. Copious irrigation was utilized and was repeatedly aspirated until clear.  The pursestring suture was used to close the umbilical fascia. An additional interrupted 0 Vicryl was placed at the umbilical fascia   We again inspected the right upper quadrant for hemostasis.  The umbilical closure was inspected and there was no air leak and nothing trapped within the closure. Pneumoperitoneum was released as we removed the trocars.  4-0 Monocryl was used to close the skin.   Benzoin, steri-strips, and clean dressings were applied. The patient  was then extubated and brought to the recovery room in stable condition. Instrument, sponge, and needle counts were correct at closure and at the conclusion of the case.   Findings:  chronic Cholecystitis with Cholelithiasis; +critical view; dilated cystic/common bile duct. Unable to perform IOC  Estimated Blood Loss: Minimal         Drains: none         Specimens: Gallbladder           Complications: None; patient tolerated the procedure well.         Disposition: PACU - hemodynamically stable.         Condition: stable  Mary Sella. Andrey Campanile, MD, FACS General, Bariatric, & Minimally Invasive Surgery [facility], [place]

## 2016-08-29 NOTE — Transfer of Care (Signed)
Immediate Anesthesia Transfer of Care Note  Patient: Jerry Hamilton  Procedure(s) Performed: Procedure(s): LAPAROSCOPIC CHOLECYSTECTOMY (N/A)  Patient Location: PACU  Anesthesia Type:General  Level of Consciousness: awake, alert  and oriented  Airway & Oxygen Therapy: Patient Spontanous Breathing and Patient connected to face mask oxygen  Post-op Assessment: Report given to RN and Post -op Vital signs reviewed and stable  Post vital signs: Reviewed and stable  Last Vitals:  Vitals:   08/29/16 0550 08/29/16 1612  BP: 102/64   Pulse: 62   Resp: 16   Temp: 36.5 C (P) 36.5 C    Last Pain:  Vitals:   08/29/16 1258  TempSrc:   PainSc: 0-No pain      Patients Stated Pain Goal: 4 (08/29/16 1258)  Complications: No apparent anesthesia complications

## 2016-08-29 NOTE — Anesthesia Postprocedure Evaluation (Addendum)
Anesthesia Post Note  Patient: Petra Kubaaron Mechling  Procedure(s) Performed: Procedure(s) (LRB): LAPAROSCOPIC CHOLECYSTECTOMY (N/A)  Patient location during evaluation: PACU Anesthesia Type: General Level of consciousness: awake and alert Pain management: pain level controlled Vital Signs Assessment: post-procedure vital signs reviewed and stable Respiratory status: spontaneous breathing, nonlabored ventilation, respiratory function stable and patient connected to nasal cannula oxygen Cardiovascular status: blood pressure returned to baseline and stable Postop Assessment: no signs of nausea or vomiting Anesthetic complications: no       Last Vitals:  Vitals:   08/29/16 1742 08/29/16 1844  BP: (!) 134/92 138/84  Pulse: 86 84  Resp:  12  Temp:  36.9 C    Last Pain:  Vitals:   08/29/16 1844  TempSrc: Oral  PainSc:                  Shelton SilvasKevin D Hollis

## 2016-08-29 NOTE — Anesthesia Procedure Notes (Signed)
Procedure Name: Intubation Date/Time: 08/29/2016 2:26 PM Performed by: Thornell MuleSTUBBLEFIELD, Cate Oravec G Pre-anesthesia Checklist: Patient identified, Emergency Drugs available, Suction available and Patient being monitored Patient Re-evaluated:Patient Re-evaluated prior to inductionOxygen Delivery Method: Circle system utilized Preoxygenation: Pre-oxygenation with 100% oxygen Intubation Type: IV induction Ventilation: Mask ventilation without difficulty Laryngoscope Size: Miller and 3 Grade View: Grade I Tube type: Oral Tube size: 7.5 mm Number of attempts: 1 Airway Equipment and Method: Stylet and Oral airway Placement Confirmation: ETT inserted through vocal cords under direct vision,  positive ETCO2 and breath sounds checked- equal and bilateral Secured at: 22 cm Tube secured with: Tape Dental Injury: Teeth and Oropharynx as per pre-operative assessment

## 2016-08-29 NOTE — Progress Notes (Signed)
After patient wiped himself down with CHG wipes with nurse tech assistance, patient's father came to nurse's station and notified nurse that patient is itchy and scratching all over. Patient's father states that patient has "sensitive skin" and that the patient had "dealt with this since he was a child." RN administered IV benadryl to patient and patient stated that he felt relief when RN went back and checked on patient several minutes later. Patient had no rash, and voices no complaints after receiving benadryl.

## 2016-08-30 ENCOUNTER — Encounter (HOSPITAL_COMMUNITY): Payer: Self-pay | Admitting: Certified Registered Nurse Anesthetist

## 2016-08-30 DIAGNOSIS — K8020 Calculus of gallbladder without cholecystitis without obstruction: Secondary | ICD-10-CM | POA: Diagnosis not present

## 2016-08-30 DIAGNOSIS — R7989 Other specified abnormal findings of blood chemistry: Secondary | ICD-10-CM | POA: Diagnosis not present

## 2016-08-30 DIAGNOSIS — K839 Disease of biliary tract, unspecified: Secondary | ICD-10-CM

## 2016-08-30 LAB — COMPREHENSIVE METABOLIC PANEL
ALT: 25 U/L (ref 17–63)
AST: 25 U/L (ref 15–41)
Albumin: 4.5 g/dL (ref 3.5–5.0)
Alkaline Phosphatase: 37 U/L — ABNORMAL LOW (ref 38–126)
Anion gap: 7 (ref 5–15)
BUN: 7 mg/dL (ref 6–20)
CHLORIDE: 105 mmol/L (ref 101–111)
CO2: 26 mmol/L (ref 22–32)
Calcium: 9.1 mg/dL (ref 8.9–10.3)
Creatinine, Ser: 0.64 mg/dL (ref 0.61–1.24)
Glucose, Bld: 116 mg/dL — ABNORMAL HIGH (ref 65–99)
POTASSIUM: 4 mmol/L (ref 3.5–5.1)
Sodium: 138 mmol/L (ref 135–145)
Total Bilirubin: 3 mg/dL — ABNORMAL HIGH (ref 0.3–1.2)
Total Protein: 6.4 g/dL — ABNORMAL LOW (ref 6.5–8.1)

## 2016-08-30 LAB — HEPATIC FUNCTION PANEL
ALK PHOS: 34 U/L — AB (ref 38–126)
ALT: 22 U/L (ref 17–63)
AST: 43 U/L — ABNORMAL HIGH (ref 15–41)
Albumin: 3.9 g/dL (ref 3.5–5.0)
BILIRUBIN DIRECT: 0.2 mg/dL (ref 0.1–0.5)
BILIRUBIN TOTAL: 2.4 mg/dL — AB (ref 0.3–1.2)
Indirect Bilirubin: 2.2 mg/dL — ABNORMAL HIGH (ref 0.3–0.9)
Total Protein: 6 g/dL — ABNORMAL LOW (ref 6.5–8.1)

## 2016-08-30 LAB — CBC
HEMATOCRIT: 41.3 % (ref 39.0–52.0)
Hemoglobin: 14.1 g/dL (ref 13.0–17.0)
MCH: 28.8 pg (ref 26.0–34.0)
MCHC: 34.1 g/dL (ref 30.0–36.0)
MCV: 84.5 fL (ref 78.0–100.0)
Platelets: 239 10*3/uL (ref 150–400)
RBC: 4.89 MIL/uL (ref 4.22–5.81)
RDW: 13.7 % (ref 11.5–15.5)
WBC: 15.1 10*3/uL — ABNORMAL HIGH (ref 4.0–10.5)

## 2016-08-30 MED ORDER — CHLORHEXIDINE GLUCONATE CLOTH 2 % EX PADS: 6.0000 | MEDICATED_PAD | Freq: Every day | CUTANEOUS | Status: DC

## 2016-08-30 MED ORDER — MUPIROCIN 2 % EX OINT: 1.0000 "application " | TOPICAL_OINTMENT | Freq: Two times a day (BID) | CUTANEOUS | Status: DC

## 2016-08-30 NOTE — Progress Notes (Signed)
Assessment Active Problems:   Symptomatic cholelithiasis status post laparoscopic cholecystectomy with attempted cholangiogram August 29, 2016-no further bleeding from incision; pain adequately controlled;   Choledocholithiasis-small stone seen on MRI.   Plan: We will inform gastroenterology of MRCP results and anticipate ERCP based on their availability.  Keep n.p.o. for now.   LOS: 0 days     1 Day Post-Op  Subjective: Not having much pain.  Parents in room.  Objective: Vital signs in last 24 hours: Temp:  [97.7 F (36.5 C)-98.7 F (37.1 C)] 97.8 F (36.6 C) (03/24 0600) Pulse Rate:  [59-118] 59 (03/24 0600) Resp:  [10-18] 16 (03/24 0600) BP: (107-162)/(53-92) 107/53 (03/24 0600) SpO2:  [96 %-100 %] 96 % (03/24 0600) Last BM Date: 08/27/16  Intake/Output from previous day: 03/23 0701 - 03/24 0700 In: 2623 [P.O.:220; I.V.:2403] Out: 560 [Urine:550; Blood:10] Intake/Output this shift: No intake/output data recorded.  PE: General- In NAD Abdomen-soft, no further bleeding from periumbilical incision.  Lab Results:   Recent Labs  08/29/16 0415 08/30/16 0511  WBC 6.2 15.1*  HGB 14.6 14.1  HCT 42.7 41.3  PLT 208 239   BMET  Recent Labs  08/29/16 0415 08/30/16 0511  NA 142 138  K 4.0 4.0  CL 108 105  CO2 26 26  GLUCOSE 90 116*  BUN 8 7  CREATININE 0.83 0.64  CALCIUM 9.3 9.1   PT/INR No results for input(s): LABPROT, INR in the last 72 hours. Comprehensive Metabolic Panel:    Component Value Date/Time   NA 138 08/30/2016 0511   NA 142 08/29/2016 0415   K 4.0 08/30/2016 0511   K 4.0 08/29/2016 0415   CL 105 08/30/2016 0511   CL 108 08/29/2016 0415   CO2 26 08/30/2016 0511   CO2 26 08/29/2016 0415   BUN 7 08/30/2016 0511   BUN 8 08/29/2016 0415   CREATININE 0.64 08/30/2016 0511   CREATININE 0.83 08/29/2016 0415   GLUCOSE 116 (H) 08/30/2016 0511   GLUCOSE 90 08/29/2016 0415   CALCIUM 9.1 08/30/2016 0511   CALCIUM 9.3 08/29/2016 0415   AST 25  08/30/2016 0511   AST 18 08/29/2016 0415   ALT 25 08/30/2016 0511   ALT 14 (L) 08/29/2016 0415   ALKPHOS 37 (L) 08/30/2016 0511   ALKPHOS 37 (L) 08/29/2016 0415   BILITOT 3.0 (H) 08/30/2016 0511   BILITOT 3.1 (H) 08/29/2016 0415   PROT 6.4 (L) 08/30/2016 0511   PROT 6.5 08/29/2016 0415   ALBUMIN 4.5 08/30/2016 0511   ALBUMIN 4.4 08/29/2016 0415     Studies/Results: Mr 3d Recon At Scanner  Result Date: 08/30/2016 CLINICAL DATA:  21 year old male inpatient with symptomatic cholelithiasis status post cholecystectomy 1 day prior. Dilated common bile duct and elevated bilirubin preoperatively. Unable to perform intraoperative cholangiogram. Dilated common bile duct and dilated cystic duct identified intraoperatively per the intraoperative note. EXAM: MRI ABDOMEN WITHOUT AND WITH CONTRAST (INCLUDING MRCP) TECHNIQUE: Multiplanar multisequence MR imaging of the abdomen was performed both before and after the administration of intravenous contrast. Heavily T2-weighted images of the biliary and pancreatic ducts were obtained, and three-dimensional MRCP images were rendered by post processing. CONTRAST:  15mL MULTIHANCE GADOBENATE DIMEGLUMINE 529 MG/ML IV SOLN COMPARISON:  08/28/2016 abdominal sonogram. FINDINGS: Lower chest: Minimal dependent atelectasis at both lung bases. Hepatobiliary: Normal liver size and configuration. No hepatic steatosis. No liver mass. Status post cholecystectomy. There is a small 2.4 x 1.4 cm gas and simple fluid collection in the cholecystectomy bed (Series 14/ image  24). Mild diffuse intrahepatic biliary ductal dilatation. Dilated common bile duct (9 mm diameter). There is a solitary 4 mm low signal filling defect in the lower third of the common bile duct near the ampulla, seen only on the 3D MRCP sequence (series 5/ images 35-36). No biliary strictures. No biliary or ampullary mass. Pancreas: No pancreatic mass or duct dilation.  No pancreas divisum. Spleen: Borderline enlarged  spleen (craniocaudal splenic length 13.2 cm). No splenic mass. Adrenals/Urinary Tract: Normal adrenals. No hydronephrosis. Normal kidneys with no renal mass. Stomach/Bowel: Grossly normal stomach. Visualized small and large bowel is normal caliber, with no bowel wall thickening. Vascular/Lymphatic: Normal caliber abdominal aorta. Patent portal, splenic, hepatic and renal veins. No pathologically enlarged lymph nodes in the abdomen. Other: No abdominal ascites. Expected free air under the right hemidiaphragm. Musculoskeletal: No aggressive appearing focal osseous lesions. IMPRESSION: 1. Mild diffuse intrahepatic biliary ductal dilatation. Dilated common bile duct (9 mm diameter). Solitary 4 mm choledocholith in the lower third of the common bile duct near the ampulla. 2. Status post cholecystectomy. Small gas and simple fluid collection in the cholecystectomy bed. Expected postoperative free air under the right hemidiaphragm. 3. Borderline enlarged spleen. This may be normal if the patient is above average in height. 4. Minimal bibasilar atelectasis. Electronically Signed   By: Delbert PhenixJason A Poff M.D.   On: 08/30/2016 09:38   Mr Abdomen Mrcp Vivien RossettiW Wo Contast  Result Date: 08/30/2016 CLINICAL DATA:  21 year old male inpatient with symptomatic cholelithiasis status post cholecystectomy 1 day prior. Dilated common bile duct and elevated bilirubin preoperatively. Unable to perform intraoperative cholangiogram. Dilated common bile duct and dilated cystic duct identified intraoperatively per the intraoperative note. EXAM: MRI ABDOMEN WITHOUT AND WITH CONTRAST (INCLUDING MRCP) TECHNIQUE: Multiplanar multisequence MR imaging of the abdomen was performed both before and after the administration of intravenous contrast. Heavily T2-weighted images of the biliary and pancreatic ducts were obtained, and three-dimensional MRCP images were rendered by post processing. CONTRAST:  15mL MULTIHANCE GADOBENATE DIMEGLUMINE 529 MG/ML IV SOLN  COMPARISON:  08/28/2016 abdominal sonogram. FINDINGS: Lower chest: Minimal dependent atelectasis at both lung bases. Hepatobiliary: Normal liver size and configuration. No hepatic steatosis. No liver mass. Status post cholecystectomy. There is a small 2.4 x 1.4 cm gas and simple fluid collection in the cholecystectomy bed (Series 14/ image 24). Mild diffuse intrahepatic biliary ductal dilatation. Dilated common bile duct (9 mm diameter). There is a solitary 4 mm low signal filling defect in the lower third of the common bile duct near the ampulla, seen only on the 3D MRCP sequence (series 5/ images 35-36). No biliary strictures. No biliary or ampullary mass. Pancreas: No pancreatic mass or duct dilation.  No pancreas divisum. Spleen: Borderline enlarged spleen (craniocaudal splenic length 13.2 cm). No splenic mass. Adrenals/Urinary Tract: Normal adrenals. No hydronephrosis. Normal kidneys with no renal mass. Stomach/Bowel: Grossly normal stomach. Visualized small and large bowel is normal caliber, with no bowel wall thickening. Vascular/Lymphatic: Normal caliber abdominal aorta. Patent portal, splenic, hepatic and renal veins. No pathologically enlarged lymph nodes in the abdomen. Other: No abdominal ascites. Expected free air under the right hemidiaphragm. Musculoskeletal: No aggressive appearing focal osseous lesions. IMPRESSION: 1. Mild diffuse intrahepatic biliary ductal dilatation. Dilated common bile duct (9 mm diameter). Solitary 4 mm choledocholith in the lower third of the common bile duct near the ampulla. 2. Status post cholecystectomy. Small gas and simple fluid collection in the cholecystectomy bed. Expected postoperative free air under the right hemidiaphragm. 3. Borderline enlarged spleen. This may  be normal if the patient is above average in height. 4. Minimal bibasilar atelectasis. Electronically Signed   By: Delbert Phenix M.D.   On: 08/30/2016 09:38    Anti-infectives: Anti-infectives    Start      Dose/Rate Route Frequency Ordered Stop   08/28/16 1200  cefTRIAXone (ROCEPHIN) 2 g in dextrose 5 % 50 mL IVPB     2 g 100 mL/hr over 30 Minutes Intravenous Daily 08/28/16 0954         Brynlea Spindler J 08/30/2016

## 2016-08-30 NOTE — Consult Note (Signed)
Referring Provider: [facility] Primary Care Physician:  No PCP Per Patient Primary Gastroenterologist:  Gentry FitzUnassigned   Reason for Consultation:  CBD stone  HPI: Jerry Kubaaron Hamilton is a 21 y.o. male who underwent lap chole on 3/23 with Dr. Andrey CampanileWilson.  IOC attempted x 3 but fluid kept leaking back around the catheter.  MRCP this AM positive for single distal CBD stone.  Total bili slightly elevated at 3.0 but remaining LFT's have been normal all along.  MRCP as follows:  IMPRESSION: 1. Mild diffuse intrahepatic biliary ductal dilatation. Dilated common bile duct (9 mm diameter). Solitary 4 mm choledocholith in the lower third of the common bile duct near the ampulla. 2. Status post cholecystectomy. Small gas and simple fluid collection in the cholecystectomy bed. Expected postoperative free air under the right hemidiaphragm. 3. Borderline enlarged spleen. This may be normal if the patient is above average in height. 4. Minimal bibasilar atelectasis.  Feels well this morning.  Minimal abdominal pain.   Past Medical History:  Diagnosis Date  . Gallstones     Past Surgical History:  Procedure Laterality Date  . CHOLECYSTECTOMY N/A 08/29/2016   Procedure: LAPAROSCOPIC CHOLECYSTECTOMY;  Surgeon: Gaynelle AduEric Wilson, MD;  Location: [facility];  Service: General;  Laterality: N/A;    Prior to Admission medications   Not on File    Current Facility-Administered Medications  Medication Dose Route Frequency Provider Last Rate Last Dose  . 0.9 % NaCl with KCl 40 mEq / L  infusion   Intravenous Continuous Sherrie GeorgeWillard Jennings, PA-C 100 mL/hr at 08/30/16 0804 100 mL/hr at 08/30/16 0804  . cefTRIAXone (ROCEPHIN) 2 g in dextrose 5 % 50 mL IVPB  2 g Intravenous Daily Sherrie GeorgeWillard Jennings, PA-C   2 g at 08/29/16 1004  . diphenhydrAMINE (BENADRYL) capsule 25 mg  25 mg Oral Q6H PRN Edson SnowballBrooke A Miller, PA-C       Or  . diphenhydrAMINE (BENADRYL) injection 25 mg  25 mg Intravenous Q6H PRN Edson SnowballBrooke A Miller, PA-C   25 mg at 08/29/16  1218  . ketorolac (TORADOL) 15 MG/ML injection 15 mg  15 mg Intravenous Q6H PRN Edson SnowballBrooke A Miller, PA-C   15 mg at 08/30/16 0543  . morphine 4 MG/ML injection 1-2 mg  1-2 mg Intravenous Q2H PRN Edson SnowballBrooke A Miller, PA-C   1 mg at 08/29/16 2204  . ondansetron (ZOFRAN-ODT) disintegrating tablet 4 mg  4 mg Oral Q6H PRN Edson SnowballBrooke A Miller, PA-C       Or  . ondansetron (ZOFRAN) injection 4 mg  4 mg Intravenous Q6H PRN Edson SnowballBrooke A Miller, PA-C   4 mg at 08/29/16 1530    Allergies as of 08/28/2016  . (No Known Allergies)    History reviewed. No pertinent family history.  Social History   Social History  . Marital status: Single    Spouse name: N/A  . Number of children: N/A  . Years of education: N/A   Occupational History  . Not on file.   Social History Main Topics  . Smoking status: Never Smoker  . Smokeless tobacco: Never Used  . Alcohol use Yes  . Drug use: Unknown  . Sexual activity: Not on file   Other Topics Concern  . Not on file   Social History Narrative  . No narrative on file    Review of Systems: ROS is O/W negative except as mentioned in HPI.  Physical Exam: Vital signs in last 24 hours: Temp:  [97.7 F (36.5 C)-98.7 F (37.1 C)] 97.8 F (  36.6 C) (03/24 0600) Pulse Rate:  [59-118] 59 (03/24 0600) Resp:  [10-18] 16 (03/24 0600) BP: (107-162)/(53-92) 107/53 (03/24 0600) SpO2:  [96 %-100 %] 96 % (03/24 0600) Last BM Date: 08/27/16 General:  Alert, Well-developed, well-nourished, pleasant and cooperative in NAD Head:  Normocephalic and atraumatic. Eyes:  Sclera clear, no icterus.  Conjunctiva pink. Ears:  Normal auditory acuity. Mouth:  No deformity or lesions.   Lungs:  Clear throughout to auscultation.  No wheezes, crackles, or rhonchi.  No increased WOB. Heart:  Regular rate and rhythm; no murmurs, clicks, rubs, or gallops. Abdomen:  Soft, non-distended.  BS present.  Dressings noted.  Appropriate TTP. Rectal:  Deferred  Msk:  Symmetrical without gross  deformities.  Pulses:  Normal pulses noted. Extremities:  Without clubbing or edema. Neurologic:  Alert and oriented x 4;  grossly normal neurologically. Skin:  Intact without significant lesions or rashes. Psych:  Alert and cooperative. Normal mood and affect.  Intake/Output from previous day: 03/23 0701 - 03/24 0700 In: 2623 [P.O.:220; I.V.:2403] Out: 560 [Urine:550; Blood:10]  Lab Results:  Recent Labs  08/28/16 0542 08/29/16 0415 08/30/16 0511  WBC 7.3 6.2 15.1*  HGB 15.1 14.6 14.1  HCT 44.2 42.7 41.3  PLT 210 208 239   BMET  Recent Labs  08/28/16 0542 08/29/16 0415 08/30/16 0511  NA 138 142 138  K 3.1* 4.0 4.0  CL 103 108 105  CO2 25 26 26   GLUCOSE 90 90 116*  BUN 11 8 7   CREATININE 0.85 0.83 0.64  CALCIUM 9.2 9.3 9.1   LFT  Recent Labs  08/30/16 0511  PROT 6.4*  ALBUMIN 4.5  AST 25  ALT 25  ALKPHOS 37*  BILITOT 3.0*   Studies/Results: Mr 3d Recon At Scanner  Result Date: 08/30/2016 CLINICAL DATA:  21 year old male inpatient with symptomatic cholelithiasis status post cholecystectomy 1 day prior. Dilated common bile duct and elevated bilirubin preoperatively. Unable to perform intraoperative cholangiogram. Dilated common bile duct and dilated cystic duct identified intraoperatively per the intraoperative note. EXAM: MRI ABDOMEN WITHOUT AND WITH CONTRAST (INCLUDING MRCP) TECHNIQUE: Multiplanar multisequence MR imaging of the abdomen was performed both before and after the administration of intravenous contrast. Heavily T2-weighted images of the biliary and pancreatic ducts were obtained, and three-dimensional MRCP images were rendered by post processing. CONTRAST:  15mL MULTIHANCE GADOBENATE DIMEGLUMINE 529 MG/ML IV SOLN COMPARISON:  08/28/2016 abdominal sonogram. FINDINGS: Lower chest: Minimal dependent atelectasis at both lung bases. Hepatobiliary: Normal liver size and configuration. No hepatic steatosis. No liver mass. Status post cholecystectomy. There  is a small 2.4 x 1.4 cm gas and simple fluid collection in the cholecystectomy bed (Series 14/ image 24). Mild diffuse intrahepatic biliary ductal dilatation. Dilated common bile duct (9 mm diameter). There is a solitary 4 mm low signal filling defect in the lower third of the common bile duct near the ampulla, seen only on the 3D MRCP sequence (series 5/ images 35-36). No biliary strictures. No biliary or ampullary mass. Pancreas: No pancreatic mass or duct dilation.  No pancreas divisum. Spleen: Borderline enlarged spleen (craniocaudal splenic length 13.2 cm). No splenic mass. Adrenals/Urinary Tract: Normal adrenals. No hydronephrosis. Normal kidneys with no renal mass. Stomach/Bowel: Grossly normal stomach. Visualized small and large bowel is normal caliber, with no bowel wall thickening. Vascular/Lymphatic: Normal caliber abdominal aorta. Patent portal, splenic, hepatic and renal veins. No pathologically enlarged lymph nodes in the abdomen. Other: No abdominal ascites. Expected free air under the right hemidiaphragm. Musculoskeletal: No aggressive appearing  focal osseous lesions. IMPRESSION: 1. Mild diffuse intrahepatic biliary ductal dilatation. Dilated common bile duct (9 mm diameter). Solitary 4 mm choledocholith in the lower third of the common bile duct near the ampulla. 2. Status post cholecystectomy. Small gas and simple fluid collection in the cholecystectomy bed. Expected postoperative free air under the right hemidiaphragm. 3. Borderline enlarged spleen. This may be normal if the patient is above average in height. 4. Minimal bibasilar atelectasis. Electronically Signed   By: Delbert Phenix M.D.   On: 08/30/2016 09:38   Mr Abdomen Mrcp Vivien Rossetti Contast  Result Date: 08/30/2016 CLINICAL DATA:  21 year old male inpatient with symptomatic cholelithiasis status post cholecystectomy 1 day prior. Dilated common bile duct and elevated bilirubin preoperatively. Unable to perform intraoperative cholangiogram.  Dilated common bile duct and dilated cystic duct identified intraoperatively per the intraoperative note. EXAM: MRI ABDOMEN WITHOUT AND WITH CONTRAST (INCLUDING MRCP) TECHNIQUE: Multiplanar multisequence MR imaging of the abdomen was performed both before and after the administration of intravenous contrast. Heavily T2-weighted images of the biliary and pancreatic ducts were obtained, and three-dimensional MRCP images were rendered by post processing. CONTRAST:  15mL MULTIHANCE GADOBENATE DIMEGLUMINE 529 MG/ML IV SOLN COMPARISON:  08/28/2016 abdominal sonogram. FINDINGS: Lower chest: Minimal dependent atelectasis at both lung bases. Hepatobiliary: Normal liver size and configuration. No hepatic steatosis. No liver mass. Status post cholecystectomy. There is a small 2.4 x 1.4 cm gas and simple fluid collection in the cholecystectomy bed (Series 14/ image 24). Mild diffuse intrahepatic biliary ductal dilatation. Dilated common bile duct (9 mm diameter). There is a solitary 4 mm low signal filling defect in the lower third of the common bile duct near the ampulla, seen only on the 3D MRCP sequence (series 5/ images 35-36). No biliary strictures. No biliary or ampullary mass. Pancreas: No pancreatic mass or duct dilation.  No pancreas divisum. Spleen: Borderline enlarged spleen (craniocaudal splenic length 13.2 cm). No splenic mass. Adrenals/Urinary Tract: Normal adrenals. No hydronephrosis. Normal kidneys with no renal mass. Stomach/Bowel: Grossly normal stomach. Visualized small and large bowel is normal caliber, with no bowel wall thickening. Vascular/Lymphatic: Normal caliber abdominal aorta. Patent portal, splenic, hepatic and renal veins. No pathologically enlarged lymph nodes in the abdomen. Other: No abdominal ascites. Expected free air under the right hemidiaphragm. Musculoskeletal: No aggressive appearing focal osseous lesions. IMPRESSION: 1. Mild diffuse intrahepatic biliary ductal dilatation. Dilated common  bile duct (9 mm diameter). Solitary 4 mm choledocholith in the lower third of the common bile duct near the ampulla. 2. Status post cholecystectomy. Small gas and simple fluid collection in the cholecystectomy bed. Expected postoperative free air under the right hemidiaphragm. 3. Borderline enlarged spleen. This may be normal if the patient is above average in height. 4. Minimal bibasilar atelectasis. Electronically Signed   By: Delbert Phenix M.D.   On: 08/30/2016 09:38   IMPRESSION:  -Choledocholithiasis:  S/p lap chole on 3/23.  IOC attempted x 3 but fluid kept leaking back around the catheter.  MRCP this AM positive for single distal CBD stone.  PLAN: -Will plan for ERCP with general anesthesia in AM on 3/25. -Continue abx for now. -Monitor LFT's. ZEHR, JESSICA D.  08/30/2016, 9:54 AM  Pager number [phone]  GI ATTENDING  History, laboratories, x-rays reviewed. Patient personally seen and examined. Agree with comprehensive history and physical as outlined above. We're asked to see the patient regarding choledocholithiasis post laparoscopic cholecystectomy. Patient has a history of known symptomatic cholelithiasis for which surgery has been recommended previously. He presented  to this hospital's emergency room the other day with pain which resolved within 5 hours after receiving pain medication. On admission his liver tests were normal except for slightly elevated bilirubin. Initial imaging demonstrated gallstones and a 9 mm bile duct but no common duct stone. His bilirubin peaked at 3.1. He underwent laparoscopic cholecystectomy. Unfortunately, unable to obtain IOC. MRCP was performed. Patient was said to have a diminutive distal common duct stone measuring 3.6 mm. He was tentatively set up for ERCP tomorrow morning. I subsequently reviewed the MRCP. The examination was unconvincing for common duct stone. The interpreting radiologist Dr. Ashley Murrain was unavailable for consultation. Thus, I reviewed the  images with 2 additional body imaging radiologists including Dr. Earlene Plater. They concurred study was equivocal at best and unlikely a distal ductal stone. In terms of his mild isolated elevated bilirubin, this had not been fractionated. Thus, I obtained liver function test this afternoon. Total bilirubin was 2.4 with unconjugated portion 2.2 consistent with benign unconjugated hyperbilirubinemia "Gilbert syndrome". Patient continues to feel fine with mild incisional tenderness only. He is a Archivist at [employer]. His parents came down for [place] to be with him and were in his room after I returned (they were out of the hospital on my initial visit). I told the patient and his parents that I felt it was less likely that he had choledocholithiasis, but could not be 100% certain. I reviewed with them the nature of ERCP in great detail (diagrams and review of risks). I also reviewed options given my clinical impression including proceeding with ERCP, clinical observation only, or less invasive elective endoscopic ultrasound to more definitively assess the bile duct. They opted for EUS. I agree. I spoke to the general surgeon on call, Dr. Gerrit Friends. We felt that the patient could be discharged home. I provided the patient a limited prescription of Norco 5/325 #15, no refills, Dr. Ardine Eng request. The floor nurse will review wound care and activity limits with the patient. Surgery will arrange for his outpatient follow-up. I will arrange for outpatient EUS. I have provided them with my card and telephone number should they have any interval questions or problems. They also have the surgical office number provided to them.  Wilhemina Bonito. Eda Keys., M.D. [facility] Division of Gastroenterology

## 2016-08-31 SURGERY — ERCP, WITH INTERVENTION IF INDICATED
Anesthesia: General

## 2016-09-01 NOTE — Discharge Summary (Signed)
Physician Discharge Summary  Patient ID: Jerry Hamilton MRN: [id number] DOB/AGE: 20/08/1995 20 y.o.  Admit date: 08/28/2016 Discharge date: 08/30/2016  Admission Diagnoses:  Symptomatic cholelithiasis  Discharge Diagnoses:  Active Problems:   Symptomatic cholelithiasis   Choledocholithiasis   Discharged Condition: fair  Hospital Course: He was admitted 08/28/2016 with reported episodes of biliary colic due to gallstones. He is noted to have elevated liver function tests. He underwent a laparoscopic cholecystectomy with attempted cholangiogram. Cholangiogram could not be performed. Postoperatively he had an MRCP which demonstrated distal common bile duct filling defect. He was seen by gastroenterology. Options were discussed with him and he chose endoscopic ultrasound. He was clinically doing well and so is decided by gastroenterology that he could be discharged home and have this arranged as an outpatient which they will do.  Consults: GI  Treatments: Laparoscopic cholecystectomy with attempted IOC  Discharge Exam: Blood pressure 112/72, pulse 60, temperature 98.2 F (36.8 C), temperature source Oral, resp. rate 14, height 5\' 11"  (1.803 m), weight 77.1 kg (170 lb), SpO2 100 %.   Disposition: 01-Home or Self Care  Discharge Instructions    Diet - low sodium heart healthy    Complete by:  As directed    Discharge instructions    Complete by:  As directed    [facility]  LAPAROSCOPIC SURGERY:  POST-OP INSTRUCTIONS  Always review your discharge instruction sheet given to you by the facility where your surgery was performed.  A prescription for pain medication may be given to you upon discharge.  Take your pain medication as prescribed.  If narcotic pain medicine is not needed, then you may take acetaminophen (Tylenol) or ibuprofen (Advil) as needed.  Take your usually prescribed medications unless otherwise directed.  If you need a refill on your pain  medication, please contact your pharmacy.  They will contact our office to request authorization. Prescriptions will not be filled after 5 P.M. or on weekends.  You should follow a light diet the first few days after arrival home, such as soup and crackers or toast.  Be sure to include plenty of fluids daily.  Most patients will experience some swelling and bruising in the area of the incisions.  Ice packs will help.  Swelling and bruising can take several days to resolve.   It is common to experience some constipation after surgery.  Increasing fluid intake and taking a stool softener (such as Colace) will usually help or prevent this problem from occurring.  A mild laxative (Milk of Magnesia or Miralax) should be taken according to package instructions if there has been no bowel movement after 48 hours.  You will have steri-strips and a gauze dressing over your incisions.  You may remove the gauze bandage on the second day after surgery, and you may shower at that time.  Leave your steri-strips (small skin tapes) in place directly over the incision.  These strips should remain on the skin for 5-7 days and then be removed.  You may get them wet in the shower and pat them dry.  Any sutures or staples will be removed at the office during your follow-up visit.  ACTIVITIES:  You may resume regular (light) daily activities beginning the next day - such as daily self-care, walking, climbing stairs - gradually increasing activities as tolerated.  You may have sexual intercourse when it is comfortable.  Refrain from any heavy lifting or straining until approved by your doctor.  You may drive when you are no  longer taking prescription pain medication, you can comfortably wear a seatbelt, and you can safely maneuver your car and apply brakes.  You should see your doctor in the office for a follow-up appointment approximately 2-3 weeks after your surgery.  Make sure that you call for this appointment within a  day or two after you arrive home to insure a convenient appointment time.  WHEN TO CALL YOUR DOCTOR: Fever over 101.0 Inability to urinate Continued bleeding from incision Increased pain, redness, or drainage from the incision Increasing abdominal pain  The clinic staff is available to answer your questions during regular business hours.  Please don't hesitate to call and ask to speak to one of the nurses for clinical concerns.  If you have a medical emergency, go to the nearest emergency room or call 911.  A surgeon from [facility] is always on call for the hospital.  Velora Heckler, MD, [facility] Office: [phone] Toll Free:  [phone] FAX [phone]  Website: [email or web address]   Increase activity slowly    Complete by:  As directed      Allergies as of 08/30/2016      Reactions   Chlorhexidine Gluconate Rash      Medication List    You have not been prescribed any medications.    Follow-up Information    [facility], [place]. Go on 09/23/2016.   Specialty:  General Surgery Why:  Your appointment is 09/24/2015 at 11:30AM. Please arrive 30 minutes prior to your appointment to check in and fill out necessary paperwork. Contact information: [address] [place] [address] [phone]          Signed: Adolph Pollack 09/01/2016, 7:28 AM

## 2016-09-02 ENCOUNTER — Telehealth: Payer: Self-pay

## 2016-09-02 NOTE — Telephone Encounter (Signed)
Dr Jacobs please advise  

## 2016-09-02 NOTE — Telephone Encounter (Signed)
-----   Message from Chrystie NoseLinda R Hunt, RN sent at 09/02/2016  2:44 PM EDT ----- Regarding: FW: EUS Spoke with pt and he knows he should be receiving a call from Chales AbrahamsPatty Lewis RN to schedule EUS. Pt [facility]  ----- Message ----- From: Hilarie FredricksonJohn N Perry, MD Sent: 09/02/2016  12:41 PM To: Chrystie NoseLinda R Hunt, RN Subject: Annell GreeningFW: EUS                                        Bonita QuinLinda, I saw this patient in the hospital this weekend. He needs endoscopic ultrasound of the bile duct. Dr. Christella HartiganJacobs nurse will call him to schedule. Please let patient know that Dr. Christella HartiganJacobs nurse will contact him regarding scheduling. Thanks ----- Message ----- From: Hilarie FredricksonJohn N Perry, MD Sent: 08/30/2016   4:19 PM To: Hilarie FredricksonJohn N Perry, MD Subject: EUS

## 2016-09-03 ENCOUNTER — Telehealth: Payer: Self-pay

## 2016-09-03 NOTE — Telephone Encounter (Signed)
See alternate note  

## 2016-09-03 NOTE — Telephone Encounter (Signed)
Patty,  he needs upper EUS +/- ERCP next available EUS Thursday for abnormal MRI.  Not during hosp week.    Thanks

## 2016-09-03 NOTE — Telephone Encounter (Signed)
-----   Message from Rachael Feeaniel P Jacobs, MD sent at 09/03/2016  7:14 AM EDT ----- Regarding: RE: EUS Got it. We'll get him scheduled.  Constancia Geeting, he needs upper EUS +/-ERCP for next available EUS Thursday (not during hosp week).  For abnormal MRI,  Thanks  DJ  ----- Message ----- From: Hilarie FredricksonJohn N Perry, MD Sent: 09/02/2016  12:39 PM To: Rachael Feeaniel P Jacobs, MD Subject: FW: EUS                                        Dan, Here is the young man referred for endoscopic ultrasound to rule out choledocholithiasis. We will inform him that your nurse will be in touch regarding scheduling Thanks again Jonny RuizJohn ----- Message ----- From: Hilarie FredricksonJohn N Perry, MD Sent: 08/30/2016   4:19 PM To: Hilarie FredricksonJohn N Perry, MD Subject: EUS

## 2016-09-04 ENCOUNTER — Other Ambulatory Visit: Payer: Self-pay

## 2016-09-04 DIAGNOSIS — R9389 Abnormal findings on diagnostic imaging of other specified body structures: Secondary | ICD-10-CM

## 2016-09-04 NOTE — Telephone Encounter (Signed)
EUSERCP scheduled, pt instructed and medications reviewed.  Patient instructions mailed to home.  Patient to call with any questions or concerns.  

## 2016-09-22 ENCOUNTER — Encounter (HOSPITAL_COMMUNITY): Payer: Self-pay | Admitting: *Deleted

## 2016-09-25 ENCOUNTER — Ambulatory Visit (HOSPITAL_COMMUNITY)
Admission: RE | Admit: 2016-09-25 | Payer: BLUE CROSS/BLUE SHIELD | Source: Ambulatory Visit | Admitting: Gastroenterology

## 2016-09-25 ENCOUNTER — Encounter (HOSPITAL_COMMUNITY): Payer: Self-pay | Admitting: Certified Registered"

## 2016-09-25 ENCOUNTER — Telehealth: Payer: Self-pay | Admitting: Gastroenterology

## 2016-09-25 SURGERY — UPPER ENDOSCOPIC ULTRASOUND (EUS) LINEAR
Anesthesia: General

## 2016-09-25 NOTE — Telephone Encounter (Signed)
He never showed for the EUS +/- ERCP.  No answer at available phone numbers.  Jerry Hamilton  Patty, Please see if he wants to reschedule for next avialable spot for EUS +/- ERCP.  IF he fails to show a second time I cannot offer a third appointment.  This is a 2 1/2 hour block.

## 2016-09-25 NOTE — Telephone Encounter (Signed)
Sorry that he did not show. Patty, if he needs me to discuss this further with him I am happy.

## 2016-09-25 NOTE — Telephone Encounter (Signed)
Left message on machine to call back  

## 2016-09-26 NOTE — Telephone Encounter (Signed)
Left message on machine to call back  

## 2016-09-26 NOTE — Telephone Encounter (Signed)
Patient calling Patty back to resch procedure that was missed yesterday 4.19.18 at [facility]

## 2016-10-08 NOTE — Telephone Encounter (Signed)
Left message on machine to call back will mail letter

## 2016-11-08 NOTE — Addendum Note (Signed)
Addendum  created 11/08/16 1046 by Kadi Hession, MD   Sign clinical note    

## 2017-07-14 ENCOUNTER — Emergency Department (HOSPITAL_COMMUNITY)
Admission: EM | Admit: 2017-07-14 | Discharge: 2017-07-14 | Discharge status: Against Medical Advice | Payer: BLUE CROSS/BLUE SHIELD

## 2017-07-14 NOTE — ED Notes (Signed)
No answer when called for triage 

## 2018-01-15 IMAGING — MR MR 3D RECON AT [facility]
19 of 25 series · 19 of 25 positions shown · IV contrast (multihance)
Comparison: 08/28/2016 abdominal sonogram.

CLINICAL DATA: 21-year-old male inpatient with symptomatic
cholelithiasis status post cholecystectomy 1 day prior. Dilated
common bile duct and elevated bilirubin preoperatively. Unable to
perform intraoperative cholangiogram. Dilated common bile duct and
dilated cystic duct identified intraoperatively per the
intraoperative note.

EXAM:
MRI ABDOMEN WITHOUT AND WITH CONTRAST (INCLUDING MRCP)
TECHNIQUE: Multiplanar multisequence MR imaging of the abdomen was performed
both before and after the administration of intravenous contrast.
Heavily T2-weighted images of the biliary and pancreatic ducts were
obtained, and three-dimensional MRCP images were rendered by post
processing.
CONTRAST:  15mL MULTIHANCE GADOBENATE DIMEGLUMINE 529 MG/ML IV SOLN

[Series 3: T2 fat-sat · axial · 5.0mm · 0.78mm/px · 1 of 63 slices shown (1 of 2)]
[im 1/63]
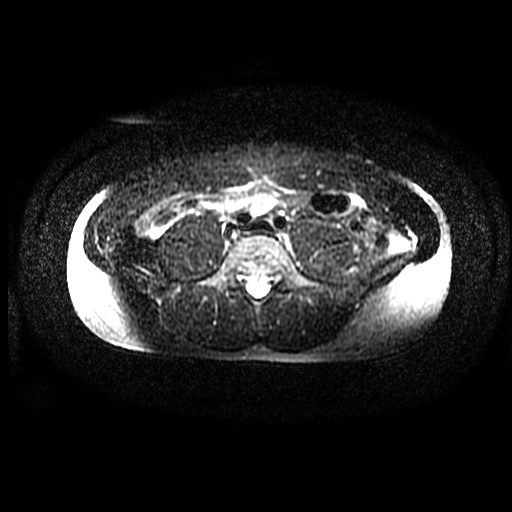

[Series 5: MRCP · coronal · 1.6mm · 0.62mm/px · 1 of 97 slices shown]
[im 1/97]
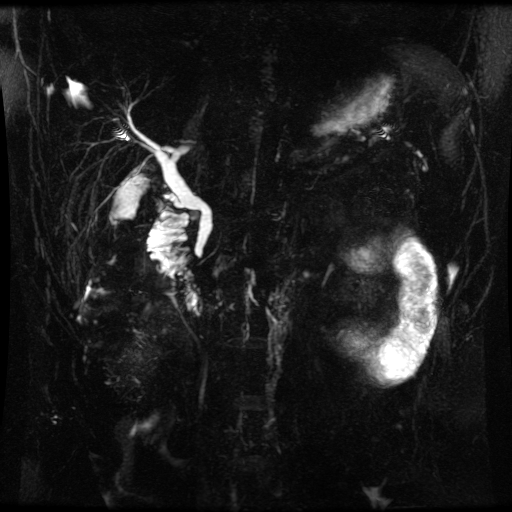

[Series 7: DWI b500 · axial · 6.0mm · 1.48mm/px · 1 of 69 slices shown]
[im 1/69]
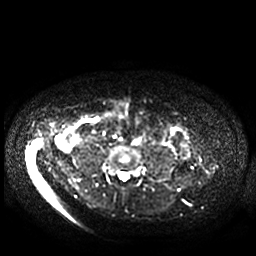

[Series 8: T2 fat-sat · axial · 5.0mm · 0.78mm/px · 1 of 35 slices shown (2 of 2)]
[im 1/35]
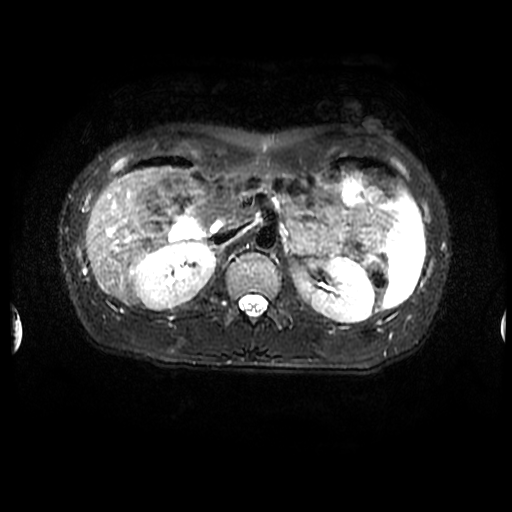

[Series 11: ax dualecho · axial · 5.0mm · 0.74mm/px · 1 of 110 slices shown]
[im 1/110]
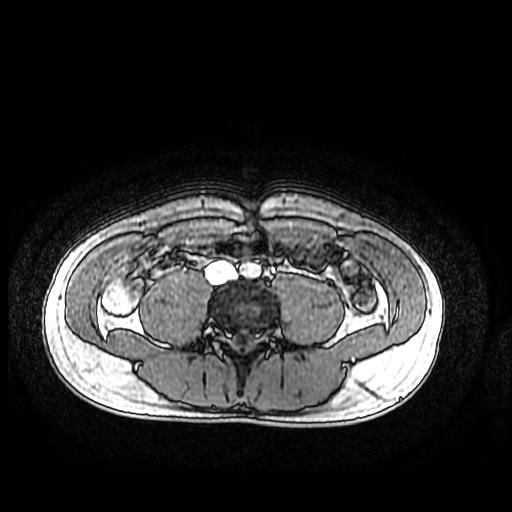

[Series 13: bSSFP fat-sat · coronal · 5.0mm · 0.70mm/px · 1 of 31 slices shown]
[im 1/31]
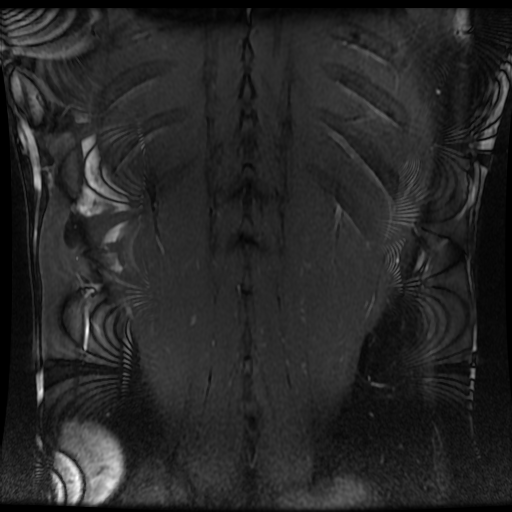

[Series 14: T2 · axial · 5.0mm · 0.78mm/px · 1 of 63 slices shown]
[im 1/63]
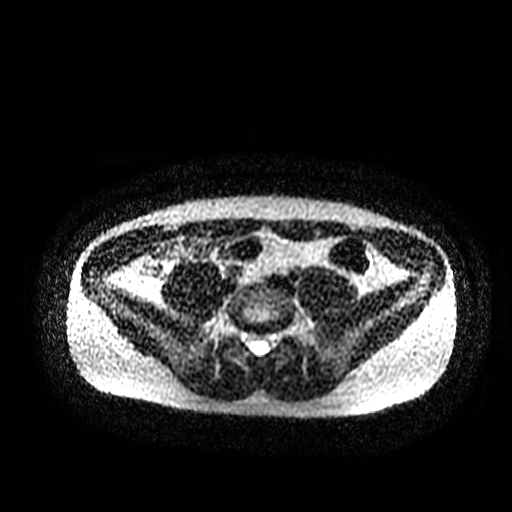

[Series 16: T1 dynamic · coronal · delayed · 4.0mm · 0.78mm/px · 1 of 80 slices shown (1 of 5)]
[im 1/80]
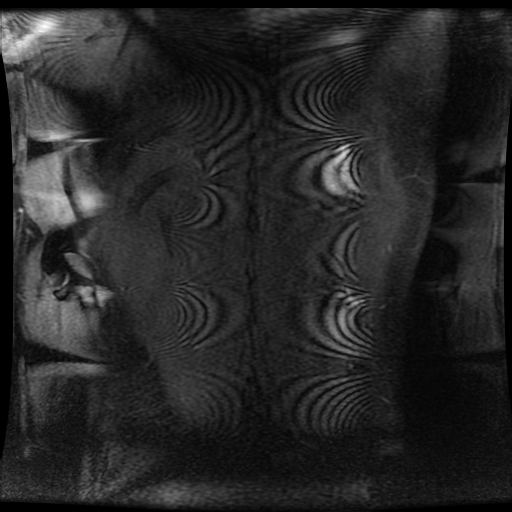

[Series 502: pjn · oblique · 1.0mm · 0.62mm/px · 1 of 120 slices shown (1 of 2)]
[im 1/120]
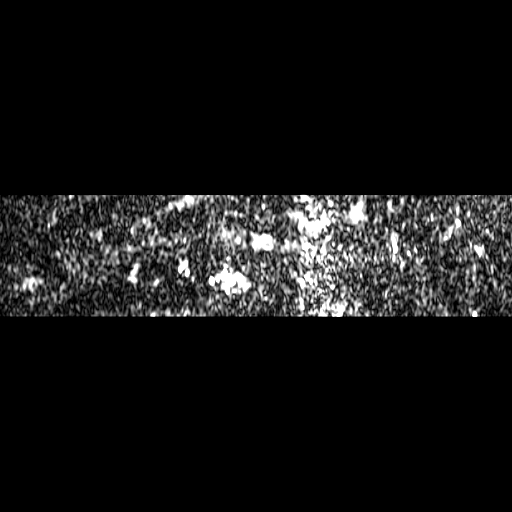

[Series 503: pjn · sagittal · 1.0mm · 0.62mm/px · 1 of 120 slices shown (2 of 2)]
[im 1/120]
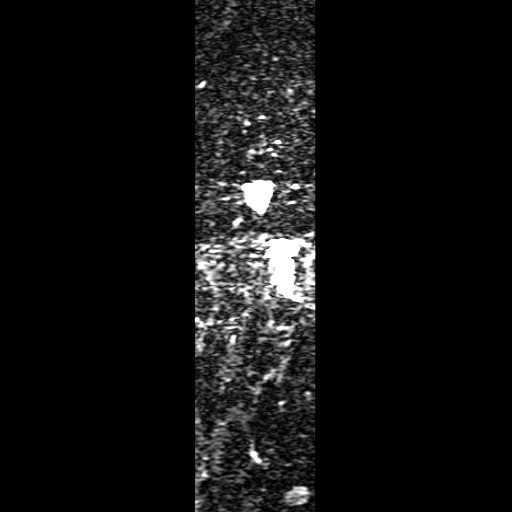

[Series 700: DWI · axial · 6.0mm · 1.48mm/px · 1 of 36 slices shown]
[im 1/36]
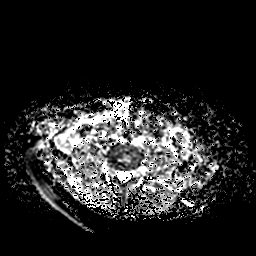

[Series 1501: T1 dynamic · axial · 5.0mm · 0.78mm/px · 1 of 88 slices shown (2 of 5)]
[im 1/88]
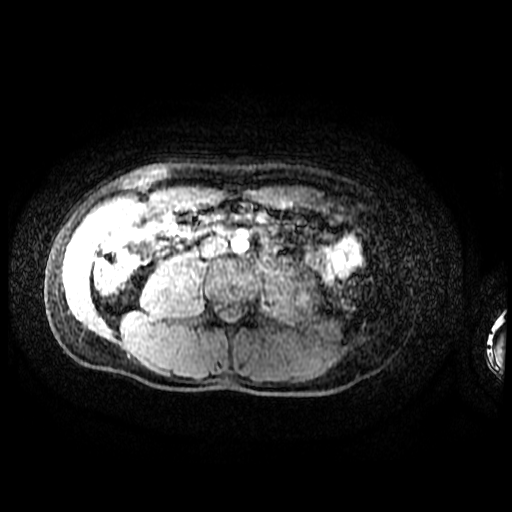

[Series 1502: T1 dynamic · axial · 5.0mm · 0.78mm/px · 1 of 88 slices shown (3 of 5)]
[im 1/88]
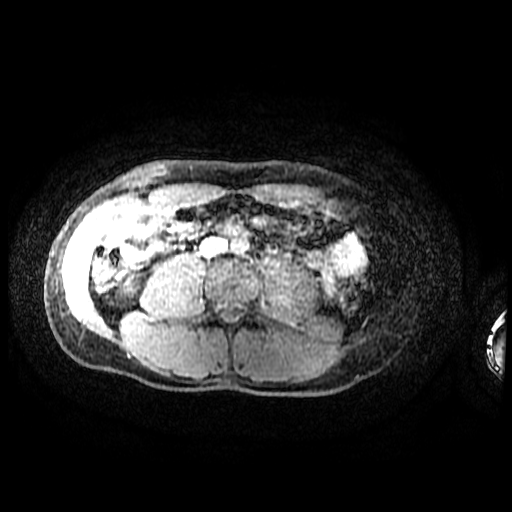

[Series 1503: T1 dynamic · axial · 5.0mm · 0.78mm/px · 1 of 88 slices shown (4 of 5)]
[im 1/88]
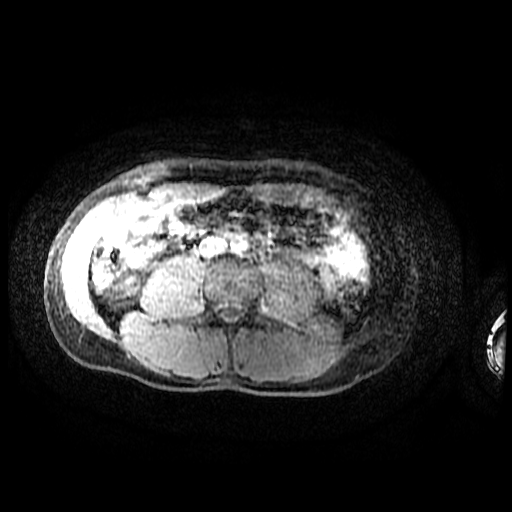

[Series 1505: T1 dynamic · axial · 5.0mm · 0.78mm/px · 1 of 88 slices shown (5 of 5)]
[im 1/88]
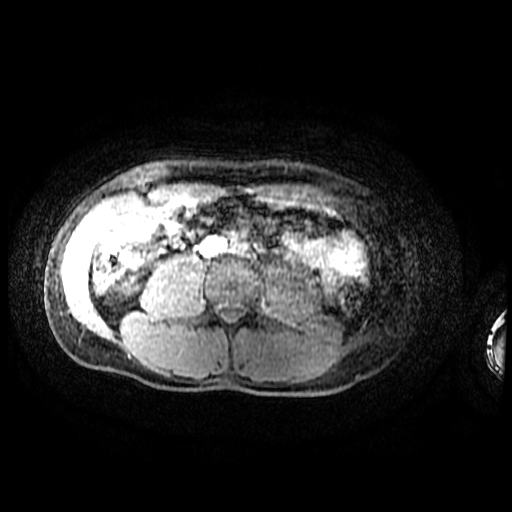

[((id)/(id)/1)-((id)/(id)/1) · axial · 5.0mm · 0.78mm/px · 1 of 88 slices shown (1 of 4)]
[im 1/88]
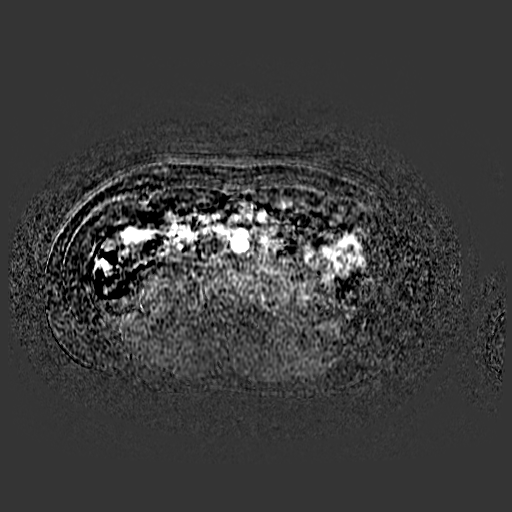

[((id)/(id)/1)-((id)/(id)/1) · axial · 5.0mm · 0.78mm/px · 1 of 88 slices shown (2 of 4)]
[im 1/88]
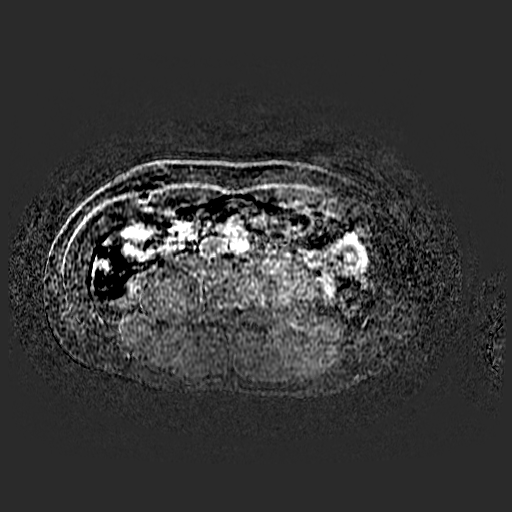

[((id)/(id)/1)-((id)/(id)/1) · axial · 5.0mm · 0.78mm/px · 1 of 88 slices shown (3 of 4)]
[im 1/88]
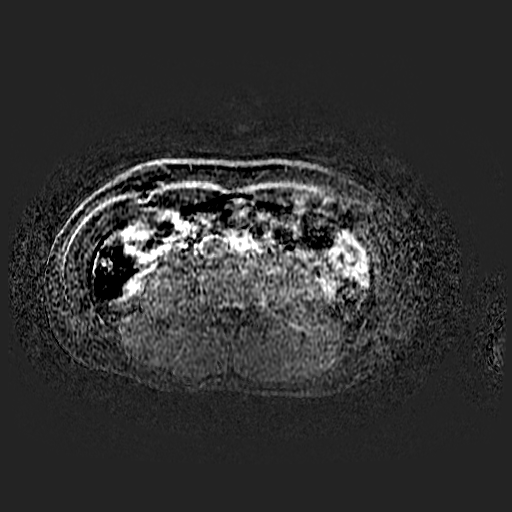

[((id)/(id)/1)-((id)/(id)/1) · axial · 5.0mm · 0.78mm/px · 1 of 88 slices shown (4 of 4)]
[im 1/88]
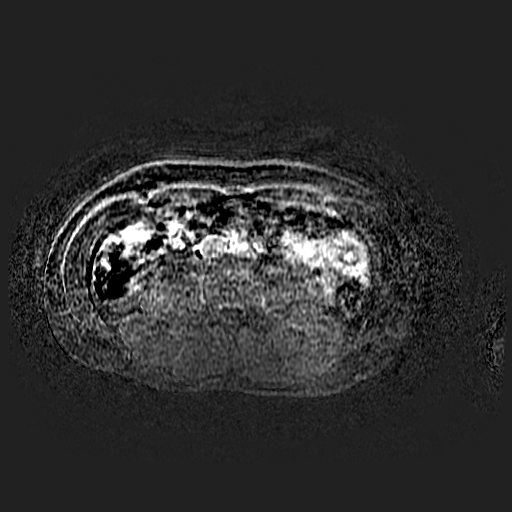

[19 of 25 positions shown; findings below may reference images not displayed]

FINDINGS: Lower chest: Minimal dependent atelectasis at both lung bases.

Hepatobiliary: Normal liver size and configuration. No hepatic
steatosis. No liver mass. Status post cholecystectomy. There is a
small 2.4 x 1.4 cm gas and simple fluid collection in the
cholecystectomy bed (Series 14/ image 24). Mild diffuse intrahepatic
biliary ductal dilatation. Dilated common bile duct (9 mm diameter).
There is a solitary 4 mm low signal filling defect in the lower
third of the common bile duct near the ampulla, seen only on the 3D
MRCP sequence (series 5/ images 35-36). No biliary strictures. No
biliary or ampullary mass.

Pancreas: No pancreatic mass or duct dilation.  No pancreas divisum.

Spleen: Borderline enlarged spleen (craniocaudal splenic length
cm). No splenic mass.

Adrenals/Urinary Tract: Normal adrenals. No hydronephrosis. Normal
kidneys with no renal mass.

Stomach/Bowel: Grossly normal stomach. Visualized small and large
bowel is normal caliber, with no bowel wall thickening.

Vascular/Lymphatic: Normal caliber abdominal aorta. Patent portal,
splenic, hepatic and renal veins. No pathologically enlarged lymph
nodes in the abdomen.

Other: No abdominal ascites. Expected free air under the right
hemidiaphragm.

Musculoskeletal: No aggressive appearing focal osseous lesions.
IMPRESSION: 1. Mild diffuse intrahepatic biliary ductal dilatation. Dilated
common bile duct (9 mm diameter). Solitary 4 mm choledocholith in
the lower third of the common bile duct near the ampulla.
2. Status post cholecystectomy. Small gas and simple fluid
collection in the cholecystectomy bed. Expected postoperative free
air under the right hemidiaphragm.
3. Borderline enlarged spleen. This may be normal if the patient is
above average in height.
4. Minimal bibasilar atelectasis.
